# Patient Record
Sex: Male | Born: 1950 | Race: White | Hispanic: No | Marital: Single | Smoking: Former smoker
Health system: Southern US, Community
[De-identification: ages and names within clinical notes are randomized; demographics above are authoritative.]

## PROBLEM LIST (undated history)

## (undated) DIAGNOSIS — I10 Essential (primary) hypertension: Secondary | ICD-10-CM

## (undated) DIAGNOSIS — N400 Enlarged prostate without lower urinary tract symptoms: Secondary | ICD-10-CM

## (undated) DIAGNOSIS — K922 Gastrointestinal hemorrhage, unspecified: Secondary | ICD-10-CM

## (undated) DIAGNOSIS — M1990 Unspecified osteoarthritis, unspecified site: Secondary | ICD-10-CM

## (undated) DIAGNOSIS — E119 Type 2 diabetes mellitus without complications: Secondary | ICD-10-CM

## (undated) DIAGNOSIS — N189 Chronic kidney disease, unspecified: Secondary | ICD-10-CM

## (undated) DIAGNOSIS — IMO0001 Reserved for inherently not codable concepts without codable children: Secondary | ICD-10-CM

## (undated) DIAGNOSIS — E7800 Pure hypercholesterolemia, unspecified: Secondary | ICD-10-CM

## (undated) DIAGNOSIS — K769 Liver disease, unspecified: Secondary | ICD-10-CM

## (undated) DIAGNOSIS — I4891 Unspecified atrial fibrillation: Secondary | ICD-10-CM

## (undated) DIAGNOSIS — I209 Angina pectoris, unspecified: Secondary | ICD-10-CM

## (undated) HISTORY — PX: KNEE ARTHROCENTESIS: SUR44

## (undated) HISTORY — PX: OTHER SURGICAL HISTORY: SHX169

---

## 2009-01-15 DIAGNOSIS — I10 Essential (primary) hypertension: Secondary | ICD-10-CM | POA: Insufficient documentation

## 2015-07-30 ENCOUNTER — Observation Stay
Admission: AD | Admit: 2015-07-30 | Discharge: 2015-07-31 | Disposition: A | Discharge status: Home / Self Care | Payer: BLUE CROSS/BLUE SHIELD | Source: Ambulatory Visit | Attending: Cardiovascular Disease | Admitting: Cardiovascular Disease

## 2015-07-30 ENCOUNTER — Encounter: Payer: Self-pay | Admitting: *Deleted

## 2015-07-30 ENCOUNTER — Encounter
Admission: AD | Disposition: A | Discharge status: Home / Self Care | Payer: Self-pay | Source: Ambulatory Visit | Attending: Cardiovascular Disease

## 2015-07-30 DIAGNOSIS — F17210 Nicotine dependence, cigarettes, uncomplicated: Secondary | ICD-10-CM | POA: Diagnosis not present

## 2015-07-30 DIAGNOSIS — M109 Gout, unspecified: Secondary | ICD-10-CM | POA: Insufficient documentation

## 2015-07-30 DIAGNOSIS — I25119 Atherosclerotic heart disease of native coronary artery with unspecified angina pectoris: Principal | ICD-10-CM | POA: Insufficient documentation

## 2015-07-30 DIAGNOSIS — I10 Essential (primary) hypertension: Secondary | ICD-10-CM | POA: Diagnosis not present

## 2015-07-30 DIAGNOSIS — I2510 Atherosclerotic heart disease of native coronary artery without angina pectoris: Secondary | ICD-10-CM | POA: Diagnosis present

## 2015-07-30 DIAGNOSIS — Z79899 Other long term (current) drug therapy: Secondary | ICD-10-CM | POA: Diagnosis not present

## 2015-07-30 DIAGNOSIS — I249 Acute ischemic heart disease, unspecified: Secondary | ICD-10-CM

## 2015-07-30 DIAGNOSIS — Z7982 Long term (current) use of aspirin: Secondary | ICD-10-CM | POA: Diagnosis not present

## 2015-07-30 DIAGNOSIS — Z9861 Coronary angioplasty status: Secondary | ICD-10-CM

## 2015-07-30 DIAGNOSIS — I240 Acute coronary thrombosis not resulting in myocardial infarction: Secondary | ICD-10-CM | POA: Diagnosis present

## 2015-07-30 HISTORY — DX: Essential (primary) hypertension: I10

## 2015-07-30 HISTORY — PX: CARDIAC CATHETERIZATION: SHX172

## 2015-07-30 HISTORY — DX: Pure hypercholesterolemia, unspecified: E78.00

## 2015-07-30 HISTORY — DX: Reserved for inherently not codable concepts without codable children: IMO0001

## 2015-07-30 HISTORY — DX: Benign prostatic hyperplasia without lower urinary tract symptoms: N40.0

## 2015-07-30 HISTORY — DX: Type 2 diabetes mellitus without complications: E11.9

## 2015-07-30 HISTORY — DX: Angina pectoris, unspecified: I20.9

## 2015-07-30 LAB — BUN: BUN: 32 mg/dL — ABNORMAL HIGH (ref 6–20)

## 2015-07-30 LAB — CREATININE, SERUM
Creatinine, Ser: 1.77 mg/dL — ABNORMAL HIGH (ref 0.61–1.24)
GFR calc Af Amer: 46 mL/min — ABNORMAL LOW (ref >60)
GFR calc non Af Amer: 39 mL/min — ABNORMAL LOW (ref >60)

## 2015-07-30 LAB — POTASSIUM: Potassium: 4 mmol/L (ref 3.5–5.1)

## 2015-07-30 LAB — HEMATOCRIT: HEMATOCRIT: 38 % — AB (ref 40.0–52.0)

## 2015-07-30 LAB — PROTIME-INR
INR: 1.19
Prothrombin Time: 15.3 seconds — ABNORMAL HIGH (ref 11.4–15.0)

## 2015-07-30 LAB — HEPARIN LEVEL (UNFRACTIONATED): Heparin Unfractionated: <0.1 [IU]/mL — ABNORMAL LOW (ref 0.30–0.70)

## 2015-07-30 LAB — HEMOGLOBIN: HEMOGLOBIN: 13.4 g/dL (ref 13.0–18.0)

## 2015-07-30 LAB — APTT: aPTT: 46 seconds — ABNORMAL HIGH (ref 24–36)

## 2015-07-30 LAB — GLUCOSE, CAPILLARY: Glucose-Capillary: 217 mg/dL — ABNORMAL HIGH (ref 65–99)

## 2015-07-30 SURGERY — LEFT HEART CATH AND CORONARY ANGIOGRAPHY
Anesthesia: Moderate Sedation

## 2015-07-30 MED ORDER — SODIUM CHLORIDE 0.9% FLUSH: 3.0000 mL | INTRAVENOUS | Status: DC | PRN

## 2015-07-30 MED ORDER — SODIUM CHLORIDE 0.9% FLUSH: 3.0000 mL | Freq: Two times a day (BID) | INTRAVENOUS | Status: DC

## 2015-07-30 MED ORDER — SODIUM CHLORIDE 0.9 % IV SOLN: 250.0000 mL | INTRAVENOUS | Status: DC | PRN

## 2015-07-30 MED ORDER — BIVALIRUDIN BOLUS VIA INFUSION - CUPID
INTRAVENOUS | Status: DC | PRN
  Administered 2015-07-30: 97.95 mg via INTRAVENOUS

## 2015-07-30 MED ORDER — HEPARIN (PORCINE) IN NACL 100-0.45 UNIT/ML-% IJ SOLN
1100.0000 [IU]/h | INTRAMUSCULAR | Status: DC
  Administered 2015-07-30: 1100 [IU]/h via INTRAVENOUS
  Filled 2015-07-30 (×2): qty 250

## 2015-07-30 MED ORDER — ONDANSETRON HCL 4 MG/2ML IJ SOLN
4.0000 mg | Freq: Four times a day (QID) | INTRAMUSCULAR | Status: DC | PRN
  Administered 2015-07-30: 4 mg via INTRAVENOUS

## 2015-07-30 MED ORDER — MIDAZOLAM HCL 2 MG/2ML IJ SOLN
INTRAMUSCULAR | Status: AC
  Filled 2015-07-30: qty 2

## 2015-07-30 MED ORDER — SODIUM CHLORIDE 0.9 % WEIGHT BASED INFUSION: 1.0000 mL/kg/h | INTRAVENOUS | Status: AC

## 2015-07-30 MED ORDER — CLOPIDOGREL BISULFATE 75 MG PO TABS: 75.0000 mg | ORAL_TABLET | Freq: Every day | ORAL | Status: DC

## 2015-07-30 MED ORDER — MIDAZOLAM HCL 2 MG/2ML IJ SOLN
INTRAMUSCULAR | Status: DC | PRN
  Administered 2015-07-30: 0.5 mg via INTRAVENOUS
  Administered 2015-07-30: 1 mg via INTRAVENOUS
  Administered 2015-07-30: 0.5 mg via INTRAVENOUS

## 2015-07-30 MED ORDER — ASPIRIN EC 325 MG PO TBEC
325.0000 mg | DELAYED_RELEASE_TABLET | Freq: Every day | ORAL | Status: DC
  Administered 2015-07-30: 325 mg via ORAL
  Filled 2015-07-30: qty 1

## 2015-07-30 MED ORDER — NITROGLYCERIN 5 MG/ML IV SOLN
INTRAVENOUS | Status: AC
  Filled 2015-07-30: qty 10

## 2015-07-30 MED ORDER — SODIUM CHLORIDE 0.9% FLUSH
3.0000 mL | Freq: Two times a day (BID) | INTRAVENOUS | Status: DC
  Administered 2015-07-30: 3 mL via INTRAVENOUS

## 2015-07-30 MED ORDER — SODIUM CHLORIDE 0.9 % WEIGHT BASED INFUSION: 3.0000 mL/kg/h | INTRAVENOUS | Status: AC

## 2015-07-30 MED ORDER — SODIUM CHLORIDE 0.9 % IV SOLN
250.0000 mg | INTRAVENOUS | Status: DC | PRN
  Administered 2015-07-30: 1.75 mg/kg/h via INTRAVENOUS

## 2015-07-30 MED ORDER — SODIUM CHLORIDE 0.9 % IV SOLN
INTRAVENOUS | Status: DC
  Administered 2015-07-30: 08:00:00 via INTRAVENOUS

## 2015-07-30 MED ORDER — ONDANSETRON HCL 4 MG/2ML IJ SOLN
4.0000 mg | Freq: Four times a day (QID) | INTRAMUSCULAR | Status: DC | PRN
  Filled 2015-07-30: qty 2

## 2015-07-30 MED ORDER — ACETAMINOPHEN 325 MG PO TABS
650.0000 mg | ORAL_TABLET | ORAL | Status: DC | PRN
  Filled 2015-07-30: qty 2

## 2015-07-30 MED ORDER — CLOPIDOGREL BISULFATE 75 MG PO TABS
ORAL_TABLET | ORAL | Status: DC | PRN
  Administered 2015-07-30: 300 mg via ORAL

## 2015-07-30 MED ORDER — BIVALIRUDIN 250 MG IV SOLR
INTRAVENOUS | Status: AC
  Filled 2015-07-30: qty 250

## 2015-07-30 MED ORDER — NITROGLYCERIN 1 MG/10 ML FOR IR/CATH LAB
INTRA_ARTERIAL | Status: DC | PRN
  Administered 2015-07-30: 200 ug via INTRACORONARY

## 2015-07-30 MED ORDER — HEPARIN (PORCINE) IN NACL 2-0.9 UNIT/ML-% IJ SOLN
INTRAMUSCULAR | Status: AC
  Filled 2015-07-30: qty 1000

## 2015-07-30 MED ORDER — FENTANYL CITRATE (PF) 100 MCG/2ML IJ SOLN
INTRAMUSCULAR | Status: DC | PRN
  Administered 2015-07-30 (×3): 25 ug via INTRAVENOUS

## 2015-07-30 MED ORDER — FENTANYL CITRATE (PF) 100 MCG/2ML IJ SOLN
INTRAMUSCULAR | Status: AC
  Filled 2015-07-30: qty 2

## 2015-07-30 MED ORDER — CLOPIDOGREL BISULFATE 75 MG PO TABS
ORAL_TABLET | ORAL | Status: AC
  Filled 2015-07-30: qty 4

## 2015-07-30 MED ORDER — HEPARIN (PORCINE) IN NACL 100-0.45 UNIT/ML-% IJ SOLN
1100.0000 [IU]/h | INTRAMUSCULAR | Status: DC
  Filled 2015-07-30: qty 250

## 2015-07-30 MED ORDER — IOHEXOL 300 MG/ML  SOLN
INTRAMUSCULAR | Status: DC | PRN
  Administered 2015-07-30: 300 mL via INTRA_ARTERIAL

## 2015-07-30 MED ORDER — ACETAMINOPHEN 325 MG PO TABS
650.0000 mg | ORAL_TABLET | ORAL | Status: DC | PRN
  Administered 2015-07-31: 650 mg via ORAL

## 2015-07-30 SURGICAL SUPPLY — 15 items
CATH INFINITI 5FR ANG PIGTAIL (CATHETERS) ×4 IMPLANT
CATH INFINITI 5FR JL4 (CATHETERS) ×4 IMPLANT
CATH INFINITI 5FR JL5 (CATHETERS) ×4 IMPLANT
CATH INFINITI JR4 5F (CATHETERS) ×4 IMPLANT
CATH VISTA GUIDE 6FR XB4 (CATHETERS) ×4 IMPLANT
DEVICE CLOSURE MYNXGRIP 6/7F (Vascular Products) ×4 IMPLANT
DEVICE INFLAT 30 PLUS (MISCELLANEOUS) ×4 IMPLANT
KIT MANI 3VAL PERCEP (MISCELLANEOUS) ×4 IMPLANT
NEEDLE PERC 18GX7CM (NEEDLE) ×4 IMPLANT
PACK CARDIAC CATH (CUSTOM PROCEDURE TRAY) ×4 IMPLANT
SHEATH AVANTI 6FR X 11CM (SHEATH) ×4 IMPLANT
SHEATH PINNACLE 5F 10CM (SHEATH) ×4 IMPLANT
STENT XIENCE ALPINE RX 2.5X15 (Permanent Stent) ×4 IMPLANT
WIRE ASAHI PROWATER 180CM (WIRE) ×4 IMPLANT
WIRE EMERALD 3MM-J .035X150CM (WIRE) ×4 IMPLANT

## 2015-07-30 NOTE — Progress Notes (Signed)
Patient urine is tea colored and dark in color. Dr. Welton Flakes notified with a new order to D/C the Heparin drip. Dr. Welton Flakes told the RN to put a discharge order in for the patient in the morning and he stated the patient should continue his home medications including ASA and Plavix and will see the patient next week. The Khan's message has been conveyed to the patient. Will continue to monitor.

## 2015-07-30 NOTE — Progress Notes (Signed)
65 yo wm admitted from cath lab s/p lt heart cath with DES to mid LAD.  A&O x4.  No distress on ra.  Cardiac monitor placed on pt and verified with Candace, CNA. Pt denies chest pain at this time.  Lungs clear bil.  Abdomen benign.  Dressing to rt groin dry and intact, pulses equal bil.  No drainage or bleeding noted at this time.  Skin assessment checked with Leslie Dales, RN.  IVF infusing well rt hand, SL lt hand flushes well.  Oriented to room and surroundings. POC reviewed with pt and wife.  CB in reach, SR up x2.

## 2015-07-30 NOTE — Progress Notes (Signed)
Zofran 4 mg IV given for complaints of nausea, will monitor.

## 2015-07-30 NOTE — Progress Notes (Signed)
Report to floor nurse.  Stent card given to wife. Check right groin for bleeding or hematoma.  Patient will be on bedrest for 2 hours post sheath pull---out of bed at  11:45  Bilateral pulses are 3's DP's.Marland Kitchen

## 2015-07-30 NOTE — Progress Notes (Signed)
Pt states nausea better

## 2015-07-30 NOTE — Progress Notes (Signed)
Wyatt [name] is a 65 y.o. male  [id number]  Primary Cardiologist: Adrian Blackwater Reason for Consultation: chest pains and pci  HPI: 64yowm with repeated episodes of chest pains, ccta done in office showed high grade lesion in mid to distal LAD after equivacal stress test, and now came for cath.  Review of Systems: no PND/Orthopnea   Past Medical History  Diagnosis Date  . Hypertension   . Anginal pain (HCC)     Medications Prior to Admission  Medication Sig Dispense Refill  . allopurinol (ZYLOPRIM) 100 MG tablet Take 100 mg by mouth daily.    Marland Kitchen aspirin EC 81 MG tablet Take 81 mg by mouth daily.    . clopidogrel (PLAVIX) 75 MG tablet Take 75 mg by mouth daily.    . colchicine 0.6 MG tablet Take 0.6 mg by mouth daily.    Marland Kitchen glyBURIDE-metformin (GLUCOVANCE) 5-500 MG tablet Take 1 tablet by mouth 2 (two) times daily with a meal.    . isosorbide dinitrate (ISORDIL) 30 MG tablet Take 30 mg by mouth 4 (four) times daily.    . metoprolol succinate (TOPROL-XL) 100 MG 24 hr tablet Take 100 mg by mouth daily. Take with or immediately following a meal.    . niacin (NIASPAN) 1000 MG CR tablet Take 1,000 mg by mouth at bedtime.    . nitroGLYCERIN (NITROSTAT) 0.4 MG SL tablet Place 0.4 mg under the tongue every 5 (five) minutes as needed for chest pain.    . valsartan-hydrochlorothiazide (DIOVAN-HCT) 320-25 MG tablet Take 1 tablet by mouth daily.       Marland Kitchen aspirin EC  325 mg Oral Daily  . [START ON 07/31/2015] clopidogrel  75 mg Oral Q breakfast  . sodium chloride flush  3 mL Intravenous Q12H  . sodium chloride flush  3 mL Intravenous Q12H  . sodium chloride flush  3 mL Intravenous Q12H    Infusions: . sodium chloride 75 mL/hr at 07/30/15 0825  . sodium chloride    . sodium chloride      No Known Allergies  Social History   Social History  . Marital Status: Single    Spouse Name: N/A  . Number of Children: N/A  . Years of Education: N/A   Occupational History  . Not on file.    Social History Main Topics  . Smoking status: Current Every Day Smoker -- 1.00 packs/day for 2 years    Types: Cigarettes  . Smokeless tobacco: Former Neurosurgeon  . Alcohol Use: 1.2 oz/week    2 Cans of beer per week  . Drug Use: No  . Sexual Activity: Not on file   Other Topics Concern  . Not on file   Social History Narrative  . No narrative on file    No family history on file.  PHYSICAL EXAM: Filed Vitals:   07/30/15 1040 07/30/15 1050  BP: 114/70 111/59  Pulse: 69 70  Resp: 21 16    No intake or output data in the 24 hours ending 07/30/15 1115  General:  Well appearing. No respiratory difficulty HEENT: normal Neck: supple. no JVD. Carotids 2+ bilat; no bruits. No lymphadenopathy or thryomegaly appreciated. Cor: PMI nondisplaced. Regular rate & rhythm. No rubs, gallops or murmurs. Lungs: clear Abdomen: soft, nontender, nondistended. No hepatosplenomegaly. No bruits or masses. Good bowel sounds. Extremities: no cyanosis, clubbing, rash, edema Neuro: alert & oriented x 3, cranial nerves grossly intact. moves all 4 extremities w/o difficulty. Affect pleasant.  ECG: NSR no  acute changes on ekg done in office yesterday  Results for orders placed or performed during the hospital encounter of 07/30/15 (from the past 24 hour(s))  Hemoglobin     Status: None   Collection Time: 07/30/15  7:33 AM  Result Value Ref Range   Hemoglobin 13.4 13.0 - 18.0 g/dL  Hematocrit     Status: Abnormal   Collection Time: 07/30/15  7:33 AM  Result Value Ref Range   HCT 38.0 (L) 40.0 - 52.0 %  BUN     Status: Abnormal   Collection Time: 07/30/15  7:33 AM  Result Value Ref Range   BUN 32 (H) 6 - 20 mg/dL  Creatinine, serum     Status: Abnormal   Collection Time: 07/30/15  7:33 AM  Result Value Ref Range   Creatinine, Ser 1.77 (H) 0.61 - 1.24 mg/dL   GFR calc non Af Amer 39 (L) >60 mL/min   GFR calc Af Amer 46 (L) >60 mL/min  Potassium     Status: None   Collection Time: 07/30/15  7:33  AM  Result Value Ref Range   Potassium 4.0 3.5 - 5.1 mmol/L   No results found.   ASSESSMENT AND PLAN: A cute coronary syndrome with cardiac cath showing mid LAD 80%,whic had PCI/DES and admitted for observation and will be discharged in am.  [name] A

## 2015-07-30 NOTE — Plan of Care (Signed)
Problem: Pain Managment: Goal: General experience of comfort will improve Outcome: Progressing Prn medications  Problem: Tissue Perfusion: Goal: Risk factors for ineffective tissue perfusion will decrease Outcome: Progressing Plavix/ASA  Problem: Phase II Progression Outcomes Goal: Cath/PCI Day Path if indicated Outcome: Progressing Stent to Mid LAD

## 2015-07-30 NOTE — Progress Notes (Signed)
ANTICOAGULATION CONSULT NOTE - Initial Consult  Pharmacy Consult for Heparin Drip Indication: chest pain/ACS and ACS/STEMI  No Known Allergies  Patient Measurements: Height:  (185.4 cm) Weight: 282 lb 6.4 oz (128.096 kg) IBW/kg (Calculated) : 79.9 Heparin Dosing Weight: 108 kg  Vital Signs: Temp: 98.1 F (36.7 C) (02/24 1156) Temp Source: Oral (02/24 1156) BP: 108/70 mmHg (02/24 1156) Pulse Rate: 61 (02/24 1156)  Labs:  Recent Labs  07/30/15 0733  HGB 13.4  HCT 38.0*  CREATININE 1.77*    Estimated Creatinine Clearance: 60.7 mL/min (by C-G formula based on Cr of 1.77).   Medical History: Past Medical History  Diagnosis Date  . Hypertension   . Anginal pain (HCC)   . Diabetes mellitus without complication (HCC)   . High cholesterol   . Shortness of breath dyspnea   . Enlarged prostate     Medications:  Scheduled:  . aspirin EC  325 mg Oral Daily  . [START ON 07/31/2015] clopidogrel  75 mg Oral Q breakfast  . sodium chloride flush  3 mL Intravenous Q12H  . sodium chloride flush  3 mL Intravenous Q12H   Infusions:  . heparin      Assessment: Pharmacy consulted to initiate heparin drip in a 66 yo male status post PCI with DES.  Spoke with MD Welton Flakes regarding heparin drip consult and MD wishes to proceed with heparin drip and start infusion now.    APTT pending (except high value due to use of bivalirudin in cath), INR: pending  Goal of Therapy:  Heparin level 0.2-0.5 units/ml Monitor platelets by anticoagulation protocol: Yes   Plan:  No bolus as patient has received bivalirudin during procedure. Will initiate with 10-12 units/kg/hr as patient just received cath. Rate to be started at 1100 units/hr.  Please target lower HL value due to the above reasons.  HL ordered for 2100 and CBC in AM.   Yasenia Reedy G 07/30/2015,2:37 PM

## 2015-07-31 DIAGNOSIS — I25119 Atherosclerotic heart disease of native coronary artery with unspecified angina pectoris: Secondary | ICD-10-CM | POA: Diagnosis not present

## 2015-07-31 LAB — CBC
HEMATOCRIT: 37.5 % — AB (ref 40.0–52.0)
Hemoglobin: 13.1 g/dL (ref 13.0–18.0)
MCH: 30.1 pg (ref 26.0–34.0)
MCHC: 35 g/dL (ref 32.0–36.0)
MCV: 86.2 fL (ref 80.0–100.0)
Platelets: 146 10*3/uL — ABNORMAL LOW (ref 150–440)
RBC: 4.35 MIL/uL — AB (ref 4.40–5.90)
RDW: 13 % (ref 11.5–14.5)
WBC: 9.1 10*3/uL (ref 3.8–10.6)

## 2015-07-31 LAB — BASIC METABOLIC PANEL
Anion gap: 6 (ref 5–15)
BUN: 27 mg/dL — AB (ref 6–20)
CHLORIDE: 105 mmol/L (ref 101–111)
CO2: 24 mmol/L (ref 22–32)
Calcium: 8.5 mg/dL — ABNORMAL LOW (ref 8.9–10.3)
Creatinine, Ser: 1.6 mg/dL — ABNORMAL HIGH (ref 0.61–1.24)
GFR calc non Af Amer: 45 mL/min — ABNORMAL LOW (ref >60)
GFR, EST AFRICAN AMERICAN: 52 mL/min — AB (ref >60)
Glucose, Bld: 204 mg/dL — ABNORMAL HIGH (ref 65–99)
POTASSIUM: 3.9 mmol/L (ref 3.5–5.1)
Sodium: 135 mmol/L (ref 135–145)

## 2015-07-31 NOTE — Progress Notes (Signed)
Patient d/c'd home. Education provided, no questions at this time. Patient picked up by family. Telemetry removed. Patient refused to take any medication this am, stated that he would take his medication once he was at home.  Trudee Kuster

## 2015-08-03 ENCOUNTER — Encounter: Payer: Self-pay | Admitting: Cardiovascular Disease

## 2015-11-29 ENCOUNTER — Observation Stay
Admission: AD | Admit: 2015-11-29 | Discharge: 2015-12-01 | Disposition: A | Discharge status: Home / Self Care | Payer: BLUE CROSS/BLUE SHIELD | Source: Ambulatory Visit | Attending: Internal Medicine | Admitting: Internal Medicine

## 2015-11-29 ENCOUNTER — Other Ambulatory Visit: Payer: Self-pay | Admitting: Cardiovascular Disease

## 2015-11-29 ENCOUNTER — Encounter: Payer: Self-pay | Admitting: *Deleted

## 2015-11-29 ENCOUNTER — Observation Stay: Payer: BLUE CROSS/BLUE SHIELD

## 2015-11-29 DIAGNOSIS — Z7982 Long term (current) use of aspirin: Secondary | ICD-10-CM | POA: Insufficient documentation

## 2015-11-29 DIAGNOSIS — N183 Chronic kidney disease, stage 3 (moderate): Secondary | ICD-10-CM | POA: Diagnosis not present

## 2015-11-29 DIAGNOSIS — Z87891 Personal history of nicotine dependence: Secondary | ICD-10-CM | POA: Insufficient documentation

## 2015-11-29 DIAGNOSIS — I25110 Atherosclerotic heart disease of native coronary artery with unstable angina pectoris: Principal | ICD-10-CM | POA: Insufficient documentation

## 2015-11-29 DIAGNOSIS — N400 Enlarged prostate without lower urinary tract symptoms: Secondary | ICD-10-CM | POA: Insufficient documentation

## 2015-11-29 DIAGNOSIS — Z820 Family history of epilepsy and other diseases of the nervous system: Secondary | ICD-10-CM | POA: Insufficient documentation

## 2015-11-29 DIAGNOSIS — M549 Dorsalgia, unspecified: Secondary | ICD-10-CM | POA: Diagnosis not present

## 2015-11-29 DIAGNOSIS — I129 Hypertensive chronic kidney disease with stage 1 through stage 4 chronic kidney disease, or unspecified chronic kidney disease: Secondary | ICD-10-CM | POA: Insufficient documentation

## 2015-11-29 DIAGNOSIS — I517 Cardiomegaly: Secondary | ICD-10-CM | POA: Insufficient documentation

## 2015-11-29 DIAGNOSIS — E1122 Type 2 diabetes mellitus with diabetic chronic kidney disease: Secondary | ICD-10-CM | POA: Insufficient documentation

## 2015-11-29 DIAGNOSIS — K219 Gastro-esophageal reflux disease without esophagitis: Secondary | ICD-10-CM | POA: Insufficient documentation

## 2015-11-29 DIAGNOSIS — Z8249 Family history of ischemic heart disease and other diseases of the circulatory system: Secondary | ICD-10-CM | POA: Insufficient documentation

## 2015-11-29 DIAGNOSIS — Z7984 Long term (current) use of oral hypoglycemic drugs: Secondary | ICD-10-CM | POA: Insufficient documentation

## 2015-11-29 DIAGNOSIS — E7800 Pure hypercholesterolemia, unspecified: Secondary | ICD-10-CM | POA: Diagnosis not present

## 2015-11-29 DIAGNOSIS — Z955 Presence of coronary angioplasty implant and graft: Secondary | ICD-10-CM | POA: Diagnosis not present

## 2015-11-29 DIAGNOSIS — Z7902 Long term (current) use of antithrombotics/antiplatelets: Secondary | ICD-10-CM | POA: Insufficient documentation

## 2015-11-29 DIAGNOSIS — M109 Gout, unspecified: Secondary | ICD-10-CM | POA: Diagnosis not present

## 2015-11-29 DIAGNOSIS — E785 Hyperlipidemia, unspecified: Secondary | ICD-10-CM | POA: Insufficient documentation

## 2015-11-29 DIAGNOSIS — I200 Unstable angina: Secondary | ICD-10-CM | POA: Diagnosis present

## 2015-11-29 HISTORY — DX: Chronic kidney disease, unspecified: N18.9

## 2015-11-29 LAB — COMPREHENSIVE METABOLIC PANEL
ALBUMIN: 4.3 g/dL (ref 3.5–5.0)
ALT: 24 U/L (ref 17–63)
ANION GAP: 8 (ref 5–15)
AST: 22 U/L (ref 15–41)
Alkaline Phosphatase: 49 U/L (ref 38–126)
BILIRUBIN TOTAL: 0.3 mg/dL (ref 0.3–1.2)
BUN: 43 mg/dL — ABNORMAL HIGH (ref 6–20)
CHLORIDE: 107 mmol/L (ref 101–111)
CO2: 23 mmol/L (ref 22–32)
Calcium: 9.5 mg/dL (ref 8.9–10.3)
Creatinine, Ser: 2.07 mg/dL — ABNORMAL HIGH (ref 0.61–1.24)
GFR calc Af Amer: 38 mL/min — ABNORMAL LOW (ref >60)
GFR calc non Af Amer: 33 mL/min — ABNORMAL LOW (ref >60)
GLUCOSE: 207 mg/dL — AB (ref 65–99)
POTASSIUM: 4.3 mmol/L (ref 3.5–5.1)
SODIUM: 138 mmol/L (ref 135–145)
TOTAL PROTEIN: 7.3 g/dL (ref 6.5–8.1)

## 2015-11-29 LAB — CBC
HEMATOCRIT: 39.2 % — AB (ref 40.0–52.0)
Hemoglobin: 14.1 g/dL (ref 13.0–18.0)
MCH: 30.1 pg (ref 26.0–34.0)
MCHC: 35.9 g/dL (ref 32.0–36.0)
MCV: 83.9 fL (ref 80.0–100.0)
PLATELETS: 147 10*3/uL — AB (ref 150–440)
RBC: 4.67 MIL/uL (ref 4.40–5.90)
RDW: 12.7 % (ref 11.5–14.5)
WBC: 8.3 10*3/uL (ref 3.8–10.6)

## 2015-11-29 LAB — TROPONIN I
Troponin I: <0.03 ng/mL (ref <0.031)
Troponin I: <0.03 ng/mL (ref <0.031)

## 2015-11-29 LAB — GLUCOSE, CAPILLARY: Glucose-Capillary: 161 mg/dL — ABNORMAL HIGH (ref 65–99)

## 2015-11-29 MED ORDER — ASPIRIN 81 MG PO CHEW
81.0000 mg | CHEWABLE_TABLET | ORAL | Status: AC
  Administered 2015-11-30: 81 mg via ORAL
  Filled 2015-11-29: qty 1

## 2015-11-29 MED ORDER — ASPIRIN 81 MG PO CHEW: 81.0000 mg | CHEWABLE_TABLET | ORAL | Status: DC

## 2015-11-29 MED ORDER — SODIUM CHLORIDE 0.9% FLUSH: 3.0000 mL | INTRAVENOUS | Status: DC | PRN

## 2015-11-29 MED ORDER — ADULT MULTIVITAMIN W/MINERALS CH
1.0000 | ORAL_TABLET | Freq: Every day | ORAL | Status: DC
  Administered 2015-12-01: 1 via ORAL
  Filled 2015-11-29: qty 1

## 2015-11-29 MED ORDER — INSULIN ASPART 100 UNIT/ML ~~LOC~~ SOLN
0.0000 [IU] | Freq: Three times a day (TID) | SUBCUTANEOUS | Status: DC
  Administered 2015-11-30 – 2015-12-01 (×2): 2 [IU] via SUBCUTANEOUS
  Administered 2015-12-01: 3 [IU] via SUBCUTANEOUS
  Filled 2015-11-29: qty 2
  Filled 2015-11-29: qty 3
  Filled 2015-11-29: qty 2

## 2015-11-29 MED ORDER — OMEGA-3-ACID ETHYL ESTERS 1 G PO CAPS
1.0000 g | ORAL_CAPSULE | Freq: Every day | ORAL | Status: DC
  Administered 2015-12-01: 1 g via ORAL
  Filled 2015-11-29: qty 1

## 2015-11-29 MED ORDER — SODIUM CHLORIDE 0.9% FLUSH
3.0000 mL | Freq: Two times a day (BID) | INTRAVENOUS | Status: DC
  Administered 2015-11-30 – 2015-12-01 (×2): 3 mL via INTRAVENOUS

## 2015-11-29 MED ORDER — SODIUM CHLORIDE 0.9% FLUSH: 3.0000 mL | Freq: Two times a day (BID) | INTRAVENOUS | Status: DC

## 2015-11-29 MED ORDER — ASPIRIN EC 81 MG PO TBEC: 81.0000 mg | DELAYED_RELEASE_TABLET | Freq: Every day | ORAL | Status: DC

## 2015-11-29 MED ORDER — SODIUM CHLORIDE 0.9 % IV SOLN
INTRAVENOUS | Status: DC
  Administered 2015-11-29 – 2015-11-30 (×2): via INTRAVENOUS

## 2015-11-29 MED ORDER — ACETAMINOPHEN 650 MG RE SUPP: 650.0000 mg | Freq: Four times a day (QID) | RECTAL | Status: DC | PRN

## 2015-11-29 MED ORDER — SODIUM CHLORIDE 0.9 % WEIGHT BASED INFUSION: 1.0000 mL/kg/h | INTRAVENOUS | Status: DC

## 2015-11-29 MED ORDER — INSULIN ASPART 100 UNIT/ML ~~LOC~~ SOLN: 0.0000 [IU] | Freq: Every day | SUBCUTANEOUS | Status: DC

## 2015-11-29 MED ORDER — HEPARIN (PORCINE) IN NACL 100-0.45 UNIT/ML-% IJ SOLN
1500.0000 [IU]/h | INTRAMUSCULAR | Status: DC
  Administered 2015-11-29 – 2015-11-30 (×2): 1500 [IU]/h via INTRAVENOUS
  Filled 2015-11-29 (×5): qty 250

## 2015-11-29 MED ORDER — METOPROLOL SUCCINATE ER 25 MG PO TB24
25.0000 mg | ORAL_TABLET | Freq: Every day | ORAL | Status: DC
  Administered 2015-12-01: 25 mg via ORAL
  Filled 2015-11-29: qty 1

## 2015-11-29 MED ORDER — NITROGLYCERIN 0.4 MG SL SUBL: 0.4000 mg | SUBLINGUAL_TABLET | SUBLINGUAL | Status: DC | PRN

## 2015-11-29 MED ORDER — SODIUM CHLORIDE 0.9 % WEIGHT BASED INFUSION: 3.0000 mL/kg/h | INTRAVENOUS | Status: DC

## 2015-11-29 MED ORDER — NIACIN 500 MG PO TABS
1000.0000 mg | ORAL_TABLET | Freq: Every day | ORAL | Status: DC
  Administered 2015-11-29 – 2015-11-30 (×2): 1000 mg via ORAL
  Filled 2015-11-29: qty 20
  Filled 2015-11-29 (×2): qty 2

## 2015-11-29 MED ORDER — COLCHICINE 0.6 MG PO TABS
0.6000 mg | ORAL_TABLET | Freq: Every day | ORAL | Status: DC
  Administered 2015-12-01: 0.6 mg via ORAL
  Filled 2015-11-29: qty 1

## 2015-11-29 MED ORDER — CLOPIDOGREL BISULFATE 75 MG PO TABS
75.0000 mg | ORAL_TABLET | Freq: Every day | ORAL | Status: DC
  Administered 2015-11-30: 75 mg via ORAL
  Filled 2015-11-29: qty 1

## 2015-11-29 MED ORDER — VALSARTAN-HYDROCHLOROTHIAZIDE 320-12.5 MG PO TABS: 1.0000 | ORAL_TABLET | Freq: Every day | ORAL | Status: DC

## 2015-11-29 MED ORDER — HEPARIN BOLUS VIA INFUSION
4000.0000 [IU] | Freq: Once | INTRAVENOUS | Status: AC
  Administered 2015-11-29: 4000 [IU] via INTRAVENOUS
  Filled 2015-11-29: qty 4000

## 2015-11-29 MED ORDER — SODIUM CHLORIDE 0.9 % WEIGHT BASED INFUSION: 3.0000 mL/kg/h | INTRAVENOUS | Status: AC

## 2015-11-29 MED ORDER — TRAMADOL HCL 50 MG PO TABS
50.0000 mg | ORAL_TABLET | Freq: Four times a day (QID) | ORAL | Status: DC | PRN
  Administered 2015-11-29: 50 mg via ORAL
  Filled 2015-11-29: qty 1

## 2015-11-29 MED ORDER — ALLOPURINOL 100 MG PO TABS
100.0000 mg | ORAL_TABLET | Freq: Every day | ORAL | Status: DC
  Administered 2015-12-01: 100 mg via ORAL
  Filled 2015-11-29: qty 1

## 2015-11-29 MED ORDER — DOXAZOSIN MESYLATE 4 MG PO TABS
4.0000 mg | ORAL_TABLET | Freq: Every day | ORAL | Status: DC
  Administered 2015-11-29 – 2015-11-30 (×2): 4 mg via ORAL
  Filled 2015-11-29 (×2): qty 1

## 2015-11-29 MED ORDER — SODIUM CHLORIDE 0.9 % IV SOLN: 250.0000 mL | INTRAVENOUS | Status: DC | PRN

## 2015-11-29 MED ORDER — ATORVASTATIN CALCIUM 10 MG PO TABS
10.0000 mg | ORAL_TABLET | Freq: Every day | ORAL | Status: DC
  Administered 2015-11-29: 10 mg via ORAL
  Filled 2015-11-29: qty 1

## 2015-11-29 MED ORDER — ACETAMINOPHEN 325 MG PO TABS: 650.0000 mg | ORAL_TABLET | Freq: Four times a day (QID) | ORAL | Status: DC | PRN

## 2015-11-29 MED ORDER — PANTOPRAZOLE SODIUM 40 MG PO TBEC
40.0000 mg | DELAYED_RELEASE_TABLET | Freq: Two times a day (BID) | ORAL | Status: DC
  Administered 2015-11-29 – 2015-12-01 (×3): 40 mg via ORAL
  Filled 2015-11-29 (×3): qty 1

## 2015-11-29 NOTE — H&P (Signed)
[facility] [place] [facility]   PATIENT NAME: Wyatt [name]    MR#:  [id number]  DATE OF BIRTH:  [birth year]  DATE OF ADMISSION:  11/29/2015  PRIMARY CARE PHYSICIAN: Bennie Pierini in [facility]  REQUESTING/REFERRING PHYSICIAN: Dr. Cain Saupe  CHIEF COMPLAINT:  Chest pain  HISTORY OF PRESENT ILLNESS:  Wyatt [name]  is a 65 y.o. male with a known history of CAD, diabetes, hypertension, hyperlipidemia. On Friday he was started on metoprolol because of rapid heart rate. He hasn't felt well since. On Saturday he had a couple episodes of chest pressure when he was walking up a hill. He had a rest for 30 minutes. The pressure was in the center of his chest felt like it was caving in. Associated with some sweating and nausea And shortness of breath. Today he saw Dr. Welton Flakes in the office and he was on the treadmill and got chest pain. They gave him 2 nitroglycerin sprays which helped. The stress test was read as abnormal and he was sent into the hospital for direct admission and the plan is for cardiac catheterization in the morning.  PAST MEDICAL HISTORY:   Past Medical History  Diagnosis Date  . Hypertension   . Anginal pain (HCC)   . Diabetes mellitus without complication (HCC)   . High cholesterol   . Shortness of breath dyspnea   . Enlarged prostate   . Chronic kidney disease     PAST SURGICAL HISTORY:   Past Surgical History  Procedure Laterality Date  . Cardiac catheterization Left 07/30/2015    Procedure: Left Heart Cath and Coronary Angiography;  Surgeon: Laurier Nancy, MD;  Location: [facility] INVASIVE CV LAB;  Service: Cardiovascular;  Laterality: Left;  . Cardiac catheterization N/A 07/30/2015    Procedure: Coronary Stent Intervention;  Surgeon: Marcina Millard, MD;  Location: [facility] INVASIVE CV LAB;  Service: Cardiovascular;  Laterality: N/A;  . Knee arthrocentesis Right   . Kidney stones      SOCIAL HISTORY:   Social History   Substance Use Topics  . Smoking status: Former Smoker -- 0.00 packs/day for 0 years    Quit date: 07/29/1969  . Smokeless tobacco: Former Neurosurgeon  . Alcohol Use: 1.2 oz/week    2 Cans of beer per week    FAMILY HISTORY:   Family History  Problem Relation Age of Onset  . CAD Mother   . Dementia Mother   . Parkinson's disease Mother   . CAD Father     DRUG ALLERGIES:  No Known Allergies  REVIEW OF SYSTEMS:  CONSTITUTIONAL: No fever. Positive for chills and sweats and fatigue. EYES: No blurred or double vision.  EARS, NOSE, AND THROAT: No tinnitus or ear pain. No sore throat RESPIRATORY: No cough, shortness of breath, wheezing or hemoptysis.  CARDIOVASCULAR: Positive for chest pain, and edema.  GASTROINTESTINAL: Some nausea. No vomiting or abdominal pain. No blood in bowel movements. Occasional diarrhea GENITOURINARY: No dysuria, hematuria.  ENDOCRINE: No polyuria, nocturia,  HEMATOLOGY: No anemia, easy bruising or bleeding SKIN: No rash or lesion. MUSCULOSKELETAL: No joint pain or arthritis.   NEUROLOGIC: No tingling, numbness, weakness.  PSYCHIATRY: No anxiety or depression.   MEDICATIONS AT HOME:   Prior to Admission medications   Medication Sig Start Date End Date Taking? Authorizing Provider  allopurinol (ZYLOPRIM) 100 MG tablet Take 100 mg by mouth daily.   Yes Historical Provider, MD  aspirin EC 81 MG tablet Take 81 mg by mouth daily.  Yes Historical Provider, MD  atorvastatin (LIPITOR) 10 MG tablet Take 10 mg by mouth at bedtime.   Yes Historical Provider, MD  celecoxib (CELEBREX) 200 MG capsule Take 200 mg by mouth at bedtime.   Yes Historical Provider, MD  Cinnamon 500 MG TABS Take 500 mg by mouth daily.   Yes Historical Provider, MD  clopidogrel (PLAVIX) 75 MG tablet Take 75 mg by mouth daily.   Yes Historical Provider, MD  colchicine 0.6 MG tablet Take 0.6 mg by mouth daily.   Yes Historical Provider, MD  doxazosin (CARDURA) 4 MG tablet Take 4 mg by mouth at  bedtime.   Yes Historical Provider, MD  glyBURIDE-metformin (GLUCOVANCE) 5-500 MG tablet Take 1 tablet by mouth 2 (two) times daily with a meal.   Yes Historical Provider, MD  metoprolol succinate (TOPROL-XL) 100 MG 24 hr tablet Take 100 mg by mouth daily.    Yes Historical Provider, MD  Multiple Vitamin (MULTIVITAMIN WITH MINERALS) TABS tablet Take 1 tablet by mouth daily.   Yes Historical Provider, MD  niacin (NIASPAN) 1000 MG CR tablet Take 1,000 mg by mouth at bedtime.   Yes Historical Provider, MD  nitroGLYCERIN (NITROSTAT) 0.4 MG SL tablet Place 0.4 mg under the tongue every 5 (five) minutes as needed for chest pain.   Yes Historical Provider, MD  omega-3 acid ethyl esters (LOVAZA) 1 g capsule Take 1 g by mouth daily.   Yes Historical Provider, MD  pantoprazole (PROTONIX) 40 MG tablet Take 40 mg by mouth 2 (two) times daily.   Yes Historical Provider, MD  traMADol (ULTRAM) 50 MG tablet Take 50 mg by mouth every 6 (six) hours as needed for moderate pain.   Yes Historical Provider, MD  valsartan-hydrochlorothiazide (DIOVAN-HCT) 320-12.5 MG tablet Take 1 tablet by mouth daily.   Yes Historical Provider, MD      VITAL SIGNS:  Blood pressure 115/62, pulse 70, temperature 98.3 F (36.8 C), temperature source Oral, resp. rate 18, height 6\' 2"  (1.88 m), weight 128.141 kg (282 lb 8 oz), SpO2 98 %.  PHYSICAL EXAMINATION:  GENERAL:  65 y.o.-year-old patient lying in the bed with no acute distress.  EYES: Pupils equal, round, reactive to light and accommodation. No scleral icterus. Extraocular muscles intact.  HEENT: Head atraumatic, normocephalic. Oropharynx and nasopharynx clear.  NECK:  Supple, no jugular venous distention. No thyroid enlargement, no tenderness.  LUNGS: Normal breath sounds bilaterally, no wheezing, rales,rhonchi or crepitation. No use of accessory muscles of respiration.  CARDIOVASCULAR: S1, S2 normal. No murmurs, rubs, or gallops.  ABDOMEN: Soft, nontender, nondistended. Bowel  sounds present. No organomegaly or mass.  EXTREMITIES: 2+ pedal edema. No cyanosis, or clubbing.  NEUROLOGIC: Cranial nerves II through XII are intact. Muscle strength 5/5 in all extremities. Sensation intact. Gait not checked.  PSYCHIATRIC: The patient is alert and oriented x 3.  SKIN: No rash, lesion, or ulcer.   LABORATORY PANEL:   Laboratory data pending  RADIOLOGY:   Chest x-ray ordered  IMPRESSION AND PLAN:   1. Unstable angina. Patient will be nothing by mouth after midnight for cardiac catheterization in the a.m. Start IV heparin. Continue aspirin and Plavix. Lower the dose of the beta blocker since that was just started on Friday. 2. Essential hypertension. Decrease the dose of Toprol and hold Diovan HCT 3. Type 2 diabetes mellitus hold diabetic medications and put on sliding scale. 4. Hyperlipidemia unspecified on Lipitor. Check a lipid profile in the a.m. 5. Chronic kidney disease stage III. Give IV  fluid hydration prior to cardiac catheter 6. Gout. Continue renally dosed medication 7. Back pain when he lies down. Patient already on medication for gastroesophageal reflux disease. This does not seem to be an aortic dissection because he is not describing the pain in this way. Continue to monitor closely.  All the records are reviewed and case discussed with ED provider. Management plans discussed with the patient, family and they are in agreement.  CODE STATUS: Full code  TOTAL TIME TAKING CARE OF THIS PATIENT: 55 minutes.    Alford HighlandWIETING, Deantre Bourdon M.D on 11/29/2015 at 5:50 PM  Between 7am to 6pm - Pager - [phone]  After 6pm call admission pager [phone]  [facility] Office  [phone]  CC: Primary care physician; Benn[facility] PieriniHeather [facility]

## 2015-11-29 NOTE — Progress Notes (Signed)
ANTICOAGULATION CONSULT NOTE - Initial Consult  Pharmacy Consult for Heparin  Indication: chest pain/ACS  No Known Allergies  Patient Measurements: Height: 6\' 2"  (188 cm) (stated) Weight: 282 lb 8 oz (128.141 kg) (admission) IBW/kg (Calculated) : 82.2 Heparin Dosing Weight: 110.4 kg   Vital Signs: Temp: 98.3 F (36.8 C) (06/26 1601) Temp Source: Oral (06/26 1601) BP: 115/62 mmHg (06/26 1601) Pulse Rate: 70 (06/26 1601)  Labs: No results for input(s): HGB, HCT, PLT, APTT, LABPROT, INR, HEPARINUNFRC, HEPRLOWMOCWT, CREATININE, CKTOTAL, CKMB, TROPONINI in the last 72 hours.  CrCl cannot be calculated (Patient has no serum creatinine result on file.).   Medical History: Past Medical History  Diagnosis Date  . Hypertension   . Anginal pain (HCC)   . Diabetes mellitus without complication (HCC)   . High cholesterol   . Shortness of breath dyspnea   . Enlarged prostate     Medications:  Prescriptions prior to admission  Medication Sig Dispense Refill Last Dose  . allopurinol (ZYLOPRIM) 100 MG tablet Take 100 mg by mouth daily.   11/29/2015 at Unknown time  . aspirin EC 81 MG tablet Take 81 mg by mouth daily.   11/29/2015 at Unknown time  . clopidogrel (PLAVIX) 75 MG tablet Take 75 mg by mouth daily.   11/29/2015 at Unknown time  . colchicine 0.6 MG tablet Take 0.6 mg by mouth daily.   11/29/2015 at Unknown time  . glyBURIDE-metformin (GLUCOVANCE) 5-500 MG tablet Take 1 tablet by mouth 2 (two) times daily with a meal.   11/29/2015 at Unknown time  . niacin (NIASPAN) 1000 MG CR tablet Take 1,000 mg by mouth at bedtime.   11/29/2015 at Unknown time  . nitroGLYCERIN (NITROSTAT) 0.4 MG SL tablet Place 0.4 mg under the tongue every 5 (five) minutes as needed for chest pain.   11/29/2015 at Unknown time  . valsartan-hydrochlorothiazide (DIOVAN-HCT) 320-25 MG tablet Take 1 tablet by mouth daily.   11/29/2015 at Unknown time  . isosorbide dinitrate (ISORDIL) 30 MG tablet Take 30 mg by mouth 4  (four) times daily. Reported on 11/29/2015   Not Taking at Unknown time  . metoprolol succinate (TOPROL-XL) 100 MG 24 hr tablet Take 100 mg by mouth daily. Reported on 11/29/2015   Not Taking at Unknown time    Assessment: CrCl =  Pharmacy consulted to dose heparin in this 65 year old male admitted with ACS.   Goal of Therapy:  Heparin level 0.3-0.7 units/ml Monitor platelets by anticoagulation protocol: Yes   Plan:  Give 4000 units bolus x 1 Start heparin infusion at 1500 units/hr Check anti-Xa level in 6 hours and daily while on heparin Continue to monitor H&H and platelets  Seymone Forlenza D 11/29/2015,4:23 PM

## 2015-11-30 ENCOUNTER — Encounter
Admission: AD | Disposition: A | Discharge status: Home / Self Care | Payer: Self-pay | Source: Ambulatory Visit | Attending: Internal Medicine

## 2015-11-30 ENCOUNTER — Ambulatory Visit
Admission: RE | Admit: 2015-11-30 | Payer: BLUE CROSS/BLUE SHIELD | Source: Ambulatory Visit | Admitting: Cardiovascular Disease

## 2015-11-30 ENCOUNTER — Ambulatory Visit: Admit: 2015-11-30 | Payer: Self-pay | Admitting: Internal Medicine

## 2015-11-30 ENCOUNTER — Encounter: Payer: Self-pay | Admitting: *Deleted

## 2015-11-30 ENCOUNTER — Other Ambulatory Visit: Payer: Self-pay

## 2015-11-30 DIAGNOSIS — I25110 Atherosclerotic heart disease of native coronary artery with unstable angina pectoris: Secondary | ICD-10-CM | POA: Diagnosis not present

## 2015-11-30 HISTORY — PX: CARDIAC CATHETERIZATION: SHX172

## 2015-11-30 LAB — LIPID PANEL
CHOL/HDL RATIO: 3.9 ratio
Cholesterol: 106 mg/dL (ref 0–200)
HDL: 27 mg/dL — ABNORMAL LOW (ref >40)
LDL CALC: UNDETERMINED mg/dL (ref 0–99)
TRIGLYCERIDES: 423 mg/dL — AB (ref <150)
VLDL: UNDETERMINED mg/dL (ref 0–40)

## 2015-11-30 LAB — BASIC METABOLIC PANEL
ANION GAP: 8 (ref 5–15)
BUN: 38 mg/dL — ABNORMAL HIGH (ref 6–20)
CO2: 23 mmol/L (ref 22–32)
Calcium: 9.2 mg/dL (ref 8.9–10.3)
Chloride: 108 mmol/L (ref 101–111)
Creatinine, Ser: 1.98 mg/dL — ABNORMAL HIGH (ref 0.61–1.24)
GFR calc Af Amer: 40 mL/min — ABNORMAL LOW (ref >60)
GFR, EST NON AFRICAN AMERICAN: 34 mL/min — AB (ref >60)
GLUCOSE: 186 mg/dL — AB (ref 65–99)
POTASSIUM: 3.9 mmol/L (ref 3.5–5.1)
Sodium: 139 mmol/L (ref 135–145)

## 2015-11-30 LAB — GLUCOSE, CAPILLARY
GLUCOSE-CAPILLARY: 197 mg/dL — AB (ref 65–99)
GLUCOSE-CAPILLARY: 155 mg/dL — AB (ref 65–99)
Glucose-Capillary: 189 mg/dL — ABNORMAL HIGH (ref 65–99)

## 2015-11-30 LAB — CBC
HEMATOCRIT: 39.8 % — AB (ref 40.0–52.0)
HEMOGLOBIN: 14.2 g/dL (ref 13.0–18.0)
MCH: 30.2 pg (ref 26.0–34.0)
MCHC: 35.6 g/dL (ref 32.0–36.0)
MCV: 84.6 fL (ref 80.0–100.0)
Platelets: 134 10*3/uL — ABNORMAL LOW (ref 150–440)
RBC: 4.71 MIL/uL (ref 4.40–5.90)
RDW: 12.8 % (ref 11.5–14.5)
WBC: 9 10*3/uL (ref 3.8–10.6)

## 2015-11-30 LAB — PROTIME-INR
INR: 1.01
Prothrombin Time: 13.5 seconds (ref 11.4–15.0)

## 2015-11-30 LAB — HEPARIN LEVEL (UNFRACTIONATED): Heparin Unfractionated: 0.41 IU/mL (ref 0.30–0.70)

## 2015-11-30 LAB — APTT: APTT: 29 s (ref 24–36)

## 2015-11-30 LAB — TROPONIN I

## 2015-11-30 SURGERY — CORONARY STENT INTERVENTION
Anesthesia: Moderate Sedation

## 2015-11-30 SURGERY — LEFT HEART CATH AND CORONARY ANGIOGRAPHY
Anesthesia: Moderate Sedation | Laterality: Left

## 2015-11-30 MED ORDER — ASPIRIN 81 MG PO CHEW
81.0000 mg | CHEWABLE_TABLET | Freq: Every day | ORAL | Status: DC
  Administered 2015-12-01: 81 mg via ORAL
  Filled 2015-11-30: qty 1

## 2015-11-30 MED ORDER — SODIUM CHLORIDE 0.9 % IV SOLN: 250.0000 mL | INTRAVENOUS | Status: DC | PRN

## 2015-11-30 MED ORDER — CLOPIDOGREL BISULFATE 75 MG PO TABS
ORAL_TABLET | ORAL | Status: AC
  Filled 2015-11-30: qty 4

## 2015-11-30 MED ORDER — MIDAZOLAM HCL 2 MG/2ML IJ SOLN
INTRAMUSCULAR | Status: AC
  Filled 2015-11-30: qty 2

## 2015-11-30 MED ORDER — HYDROMORPHONE HCL 1 MG/ML IJ SOLN
1.0000 mg | Freq: Once | INTRAMUSCULAR | Status: AC
  Administered 2015-11-30: 1 mg via INTRAVENOUS

## 2015-11-30 MED ORDER — FENTANYL CITRATE (PF) 100 MCG/2ML IJ SOLN
INTRAMUSCULAR | Status: AC
  Filled 2015-11-30: qty 2

## 2015-11-30 MED ORDER — TICAGRELOR 90 MG PO TABS
90.0000 mg | ORAL_TABLET | Freq: Two times a day (BID) | ORAL | Status: DC
  Administered 2015-12-01: 90 mg via ORAL
  Filled 2015-11-30: qty 1

## 2015-11-30 MED ORDER — SODIUM CHLORIDE 0.9 % IV SOLN: INTRAVENOUS | Status: DC

## 2015-11-30 MED ORDER — HEPARIN (PORCINE) IN NACL 2-0.9 UNIT/ML-% IJ SOLN
INTRAMUSCULAR | Status: AC
  Filled 2015-11-30: qty 1000

## 2015-11-30 MED ORDER — SODIUM BICARBONATE 8.4 % IV SOLN
INTRAVENOUS | Status: DC
  Filled 2015-11-30: qty 1000

## 2015-11-30 MED ORDER — BIVALIRUDIN 250 MG IV SOLR
250.0000 mg | INTRAVENOUS | Status: DC | PRN
  Administered 2015-11-30: 1.75 mg/kg/h via INTRAVENOUS

## 2015-11-30 MED ORDER — ATORVASTATIN CALCIUM 20 MG PO TABS
40.0000 mg | ORAL_TABLET | Freq: Every day | ORAL | Status: DC
  Administered 2015-11-30: 40 mg via ORAL
  Filled 2015-11-30: qty 2
  Filled 2015-11-30: qty 1

## 2015-11-30 MED ORDER — ONDANSETRON HCL 4 MG/2ML IJ SOLN
4.0000 mg | Freq: Four times a day (QID) | INTRAMUSCULAR | Status: DC | PRN
  Administered 2015-11-30: 4 mg via INTRAVENOUS

## 2015-11-30 MED ORDER — SODIUM BICARBONATE BOLUS VIA INFUSION
INTRAVENOUS | Status: DC
Start: 2015-12-01 — End: 2015-11-30
  Filled 2015-11-30: qty 1

## 2015-11-30 MED ORDER — FENTANYL CITRATE (PF) 100 MCG/2ML IJ SOLN
INTRAMUSCULAR | Status: DC | PRN
  Administered 2015-11-30: 25 ug via INTRAVENOUS

## 2015-11-30 MED ORDER — HYDROMORPHONE HCL 1 MG/ML IJ SOLN
INTRAMUSCULAR | Status: AC
  Filled 2015-11-30: qty 1

## 2015-11-30 MED ORDER — SODIUM CHLORIDE 0.9 % WEIGHT BASED INFUSION
3.0000 mL/kg/h | INTRAVENOUS | Status: AC
  Administered 2015-11-30: 3 mL/kg/h via INTRAVENOUS

## 2015-11-30 MED ORDER — HEPARIN (PORCINE) IN NACL 2-0.9 UNIT/ML-% IJ SOLN
INTRAMUSCULAR | Status: AC
  Filled 2015-11-30: qty 500

## 2015-11-30 MED ORDER — SODIUM BICARBONATE 8.4 % IV SOLN
INTRAVENOUS | Status: DC
  Filled 2015-11-30 (×2): qty 500

## 2015-11-30 MED ORDER — PROMETHAZINE HCL 25 MG/ML IJ SOLN
25.0000 mg | Freq: Four times a day (QID) | INTRAMUSCULAR | Status: DC | PRN
  Administered 2015-11-30: 25 mg via INTRAVENOUS
  Filled 2015-11-30: qty 1

## 2015-11-30 MED ORDER — IOPAMIDOL (ISOVUE-300) INJECTION 61%
INTRAVENOUS | Status: DC | PRN
  Administered 2015-11-30: 290 mL via INTRA_ARTERIAL

## 2015-11-30 MED ORDER — MIDAZOLAM HCL 2 MG/2ML IJ SOLN
INTRAMUSCULAR | Status: DC | PRN
  Administered 2015-11-30: 1 mg via INTRAVENOUS

## 2015-11-30 MED ORDER — SODIUM BICARBONATE 8.4 % IV SOLN
INTRAVENOUS | Status: DC
  Filled 2015-11-30: qty 500

## 2015-11-30 MED ORDER — FENTANYL CITRATE (PF) 100 MCG/2ML IJ SOLN
INTRAMUSCULAR | Status: DC | PRN
  Administered 2015-11-30: 50 ug via INTRAVENOUS

## 2015-11-30 MED ORDER — BIVALIRUDIN BOLUS VIA INFUSION - CUPID
INTRAVENOUS | Status: DC | PRN
  Administered 2015-11-30: 96.075 mg via INTRAVENOUS

## 2015-11-30 MED ORDER — CLOPIDOGREL BISULFATE 75 MG PO TABS
ORAL_TABLET | ORAL | Status: DC | PRN
  Administered 2015-11-30: 300 mg via ORAL

## 2015-11-30 MED ORDER — BIVALIRUDIN 250 MG IV SOLR
INTRAVENOUS | Status: AC
  Filled 2015-11-30: qty 250

## 2015-11-30 MED ORDER — IOPAMIDOL (ISOVUE-300) INJECTION 61%
INTRAVENOUS | Status: DC | PRN
  Administered 2015-11-30: 45 mL via INTRA_ARTERIAL

## 2015-11-30 MED ORDER — ACETAMINOPHEN 325 MG PO TABS: 650.0000 mg | ORAL_TABLET | ORAL | Status: DC | PRN

## 2015-11-30 MED ORDER — SODIUM CHLORIDE 0.9% FLUSH: 3.0000 mL | INTRAVENOUS | Status: DC | PRN

## 2015-11-30 MED ORDER — SODIUM CHLORIDE 0.9% FLUSH: 3.0000 mL | Freq: Two times a day (BID) | INTRAVENOUS | Status: DC

## 2015-11-30 MED ORDER — ASPIRIN 81 MG PO CHEW: 81.0000 mg | CHEWABLE_TABLET | ORAL | Status: DC

## 2015-11-30 MED ORDER — ONDANSETRON HCL 4 MG/2ML IJ SOLN
INTRAMUSCULAR | Status: AC
  Filled 2015-11-30: qty 2

## 2015-11-30 MED ORDER — NITROGLYCERIN 5 MG/ML IV SOLN
INTRAVENOUS | Status: AC
  Filled 2015-11-30: qty 10

## 2015-11-30 MED ORDER — SODIUM BICARBONATE BOLUS VIA INFUSION
INTRAVENOUS | Status: AC
  Administered 2015-11-30: 10:00:00 via INTRAVENOUS
  Filled 2015-11-30: qty 1

## 2015-11-30 MED ORDER — SODIUM CHLORIDE 0.9% FLUSH
3.0000 mL | Freq: Two times a day (BID) | INTRAVENOUS | Status: DC
  Administered 2015-12-01: 3 mL via INTRAVENOUS

## 2015-11-30 MED ORDER — MORPHINE SULFATE (PF) 2 MG/ML IV SOLN: 2.0000 mg | INTRAVENOUS | Status: DC | PRN

## 2015-11-30 SURGICAL SUPPLY — 13 items
BALLN TREK RX 2.5X20 (BALLOONS) ×3
BALLN ~~LOC~~ TREK RX 4.0X15 (BALLOONS) ×3
BALLOON TREK RX 2.5X20 (BALLOONS) ×1 IMPLANT
BALLOON ~~LOC~~ TREK RX 4.0X15 (BALLOONS) ×1 IMPLANT
CATH VISTA GUIDE 6FR JR4 (CATHETERS) ×3 IMPLANT
DEVICE CLOSURE MYNXGRIP 6/7F (Vascular Products) ×3 IMPLANT
DEVICE INFLAT 30 PLUS (MISCELLANEOUS) ×6 IMPLANT
KIT MANI 3VAL PERCEP (MISCELLANEOUS) ×3 IMPLANT
PACK CARDIAC CATH (CUSTOM PROCEDURE TRAY) ×3 IMPLANT
SHEATH AVANTI 6FR X 11CM (SHEATH) ×3 IMPLANT
STENT XIENCE ALPINE RX 3.0X23 (Permanent Stent) ×3 IMPLANT
WIRE EMERALD 3MM-J .035X150CM (WIRE) ×3 IMPLANT
WIRE G HI TQ BMW 190 (WIRE) ×3 IMPLANT

## 2015-11-30 SURGICAL SUPPLY — 10 items
CATH INFINITI 5FR ANG PIGTAIL (CATHETERS) ×3 IMPLANT
CATH INFINITI 5FR JL4 (CATHETERS) ×3 IMPLANT
CATH INFINITI JR4 5F (CATHETERS) ×3 IMPLANT
KIT MANI 3VAL PERCEP (MISCELLANEOUS) ×3 IMPLANT
KIT TRANSPAC II SGL 4260605 (MISCELLANEOUS) ×3 IMPLANT
NEEDLE PERC 18GX7CM (NEEDLE) ×3 IMPLANT
PACK CARDIAC CATH (CUSTOM PROCEDURE TRAY) ×3 IMPLANT
SHEATH PINNACLE 5F 10CM (SHEATH) ×3 IMPLANT
SUT SILK 0 FSL (SUTURE) ×3 IMPLANT
WIRE EMERALD 3MM-J .035X150CM (WIRE) ×3 IMPLANT

## 2015-11-30 NOTE — Progress Notes (Signed)
Returned from cath lab, no distress on ra. IVF infusing well rt fa.  Dressing to rt groin dry and intact.  VSS.  Denies need. CB in reach, SR up x 2.

## 2015-11-30 NOTE — Progress Notes (Signed)
Pt continues to complain of nausea, page out to Dr. Welton FlakesKhan, New orders received

## 2015-11-30 NOTE — Plan of Care (Signed)
Problem: Pain Managment: Goal: General experience of comfort will improve Outcome: Progressing Prn medications  Problem: Tissue Perfusion: Goal: Risk factors for ineffective tissue perfusion will decrease Outcome: Progressing Heparin gtt   

## 2015-11-30 NOTE — OR Nursing (Signed)
Pt vomited and given zofran iv 4 mg, with relief.

## 2015-11-30 NOTE — Progress Notes (Signed)
To specials via bed 

## 2015-11-30 NOTE — Progress Notes (Signed)
[employer] - [place] [facility]   PATIENT NAME: Wyatt [name]    MR#:  [id number]  DATE OF BIRTH:  03/10/1951  SUBJECTIVE:  CHIEF COMPLAINT:  No chief complaint on file.   Positive stress test recently in the office with chest pain.   Cardiac as was done today and stent was placed in distal RCA.   Seen in recovery room after his cardiac catheterization.  REVIEW OF SYSTEMS:  CONSTITUTIONAL: No fever, fatigue or weakness.  EYES: No blurred or double vision.  EARS, NOSE, AND THROAT: No tinnitus or ear pain.  RESPIRATORY: No cough, shortness of breath, wheezing or hemoptysis.  CARDIOVASCULAR: No chest pain, orthopnea, edema.  GASTROINTESTINAL: No nausea, vomiting, diarrhea or abdominal pain.  GENITOURINARY: No dysuria, hematuria.  ENDOCRINE: No polyuria, nocturia,  HEMATOLOGY: No anemia, easy bruising or bleeding SKIN: No rash or lesion. MUSCULOSKELETAL: No joint pain or arthritis.   NEUROLOGIC: No tingling, numbness, weakness.  PSYCHIATRY: No anxiety or depression.   ROS  DRUG ALLERGIES:  No Known Allergies  VITALS:  Blood pressure 134/83, pulse 78, temperature 97.8 F (36.6 C), temperature source Oral, resp. rate 13, height 6\' 2"  (1.88 m), weight 128.141 kg (282 lb 8 oz), SpO2 93 %.  PHYSICAL EXAMINATION:  GENERAL:  65 y.o.-year-old patient lying in the bed with no acute distress.  EYES: Pupils equal, round, reactive to light and accommodation. No scleral icterus. Extraocular muscles intact.  HEENT: Head atraumatic, normocephalic. Oropharynx and nasopharynx clear.  NECK:  Supple, no jugular venous distention. No thyroid enlargement, no tenderness.  LUNGS: Normal breath sounds bilaterally, no wheezing, rales,rhonchi or crepitation. No use of accessory muscles of respiration.  CARDIOVASCULAR: S1, S2 normal. No murmurs, rubs, or gallops.  ABDOMEN: Soft, nontender, nondistended. Bowel sounds present. No organomegaly or mass.  EXTREMITIES: No pedal edema,  cyanosis, or clubbing.  NEUROLOGIC: Cranial nerves II through XII are intact. Muscle strength 5/5 in all extremities. Sensation intact. Gait not checked.  PSYCHIATRIC: The patient is alert and oriented x 3.  SKIN: No obvious rash, lesion, or ulcer.   Physical Exam LABORATORY PANEL:   CBC  Recent Labs Lab 11/30/15 0134  WBC 9.0  HGB 14.2  HCT 39.8*  PLT 134*   ------------------------------------------------------------------------------------------------------------------  Chemistries   Recent Labs Lab 11/29/15 1644 11/30/15 0134  NA 138 139  K 4.3 3.9  CL 107 108  CO2 23 23  GLUCOSE 207* 186*  BUN 43* 38*  CREATININE 2.07* 1.98*  CALCIUM 9.5 9.2  AST 22  --   ALT 24  --   ALKPHOS 49  --   BILITOT 0.3  --    ------------------------------------------------------------------------------------------------------------------  Cardiac Enzymes  Recent Labs Lab 11/29/15 2047 11/29/15 2353  TROPONINI <0.03 <0.03   ------------------------------------------------------------------------------------------------------------------  RADIOLOGY:  Dg Chest 1 View  11/29/2015  CLINICAL DATA:  Rapid heart rate. Chest pressure. Known history of coronary artery disease, diabetes, hypertension, hyperlipidemia. EXAM: CHEST 1 VIEW COMPARISON:  None. FINDINGS: Mild cardiomegaly. Lungs are clear. No evidence of pneumonia. No pleural effusion or pneumothorax seen. Osseous structures about the chest are unremarkable. IMPRESSION: Mild cardiomegaly.  No active disease. Electronically Signed   By: Bary RichardStan  Maynard [name].   On: 11/29/2015 18:07    ASSESSMENT AND PLAN:   Active Problems:   Unstable angina (HCC)  1. Angina with CAD.  Given IV heparin. Continue aspirin and Plavix. Lower the dose of the beta blocker since that was just started on Friday.   Cath done- distal RCA stent  placed 11/30/15. 2. Essential hypertension. Decrease the dose of Toprol and hold Diovan HCT 3. Type 2 diabetes  mellitus hold diabetic medications and put on sliding scale. 4. Hyperlipidemia unspecified on Lipitor. Check a lipid profile in the a.m. 5. Chronic kidney disease stage III. Give IV fluid hydration prior to cardiac catheter 6. Gout. Continue renally dosed medication 7. Back pain when he lies down. Patient already on medication for gastroesophageal reflux disease. This does not seem to be an aortic dissection because he is not describing the pain in this way. Continue to monitor closely.    All the records are reviewed and case discussed with Care Management/Social Workerr. Management plans discussed with the patient, family and they are in agreement.  CODE STATUS: full  TOTAL TIME TAKING CARE OF THIS PATIENT: 35 minutes.     POSSIBLE D/C IN 1-2 DAYS, DEPENDING ON CLINICAL CONDITION.   Altamese DillingVACHHANI, Wyatt [name] on 11/30/2015   Between 7am to 6pm - Pager - [phone]  After 6pm go to [email or web address] - password [email or web address]  [facility] [place] [facility]  Office  [phone]  CC: Primary care physician; Wyatt [name],JOHN, Wyatt [name]  Note: This dictation was prepared with Dragon dictation along with smaller phrase technology. Any transcriptional errors that result from this process are unintentional.

## 2015-11-30 NOTE — Progress Notes (Signed)
Wyatt [name] is a 65 y.o. male  [id number]  Primary Cardiologist: Adrian BlackwaterShaukat Runette [name] Reason for Consultation: Chest pain  HPI: This is a 65 year old white male with a past medical history of PCI and stenting about 6 weeks ago at [facility] mid LAD presented to the hospital with recurrent chest pain associated with shortness of breath and diaphoresis. Patient states that the he was fine until he saw me Friday and was prescribed metoprolol succinate 100 mg by mouth once a day because of tachycardia and started having chest pain on Saturday and Sunday. He felt like he was going to pass out. He underwent a nuclear stress test in the office yesterday which showed anterior apical and inferior wall reversible defect with LVEF 50% and echocardiogram also revealed EF of 50% with inferior hypokinesis. He still has pressure in his chest. But for some reason his creatinine is elevated.   Review of Systems: No syncope but feels dizzy and short of breath.   Past Medical History  Diagnosis Date  . Hypertension   . Anginal pain (HCC)   . Diabetes mellitus without complication (HCC)   . High cholesterol   . Shortness of breath dyspnea   . Enlarged prostate   . Chronic kidney disease     Medications Prior to Admission  Medication Sig Dispense Refill  . allopurinol (ZYLOPRIM) 100 MG tablet Take 100 mg by mouth daily.    Marland Kitchen. aspirin EC 81 MG tablet Take 81 mg by mouth daily.    Marland Kitchen. atorvastatin (LIPITOR) 10 MG tablet Take 10 mg by mouth at bedtime.    . celecoxib (CELEBREX) 200 MG capsule Take 200 mg by mouth at bedtime.    . Cinnamon 500 MG TABS Take 500 mg by mouth daily.    . clopidogrel (PLAVIX) 75 MG tablet Take 75 mg by mouth daily.    . colchicine 0.6 MG tablet Take 0.6 mg by mouth daily.    Marland Kitchen. doxazosin (CARDURA) 4 MG tablet Take 4 mg by mouth at bedtime.    Marland Kitchen. glyBURIDE-metformin (GLUCOVANCE) 5-500 MG tablet Take 1 tablet by mouth 2 (two) times daily with a meal.    .  metoprolol succinate (TOPROL-XL) 100 MG 24 hr tablet Take 100 mg by mouth daily.     . Multiple Vitamin (MULTIVITAMIN WITH MINERALS) TABS tablet Take 1 tablet by mouth daily.    . niacin (NIASPAN) 1000 MG CR tablet Take 1,000 mg by mouth at bedtime.    . nitroGLYCERIN (NITROSTAT) 0.4 MG SL tablet Place 0.4 mg under the tongue every 5 (five) minutes as needed for chest pain.    Marland Kitchen. omega-3 acid ethyl esters (LOVAZA) 1 g capsule Take 1 g by mouth daily.    . pantoprazole (PROTONIX) 40 MG tablet Take 40 mg by mouth 2 (two) times daily.    . traMADol (ULTRAM) 50 MG tablet Take 50 mg by mouth every 6 (six) hours as needed for moderate pain.    . valsartan-hydrochlorothiazide (DIOVAN-HCT) 320-12.5 MG tablet Take 1 tablet by mouth daily.       Wyatt [name]. [MAR Hold] allopurinol  100 mg Oral Daily  . [MAR Hold] aspirin EC  81 mg Oral Daily  . [MAR Hold] atorvastatin  10 mg Oral QHS  . [MAR Hold] clopidogrel  75 mg Oral Daily  . [MAR Hold] colchicine  0.6 mg Oral Daily  . [MAR Hold] doxazosin  4 mg Oral QHS  . [MAR Hold] insulin aspart  0-5  Units Subcutaneous QHS  . [MAR Hold] insulin aspart  0-9 Units Subcutaneous TID WC  . [MAR Hold] metoprolol succinate  25 mg Oral Daily  . [MAR Hold] multivitamin with minerals  1 tablet Oral Daily  . [MAR Hold] niacin  1,000 mg Oral QHS  . [MAR Hold] omega-3 acid ethyl esters  1 g Oral Daily  . [MAR Hold] pantoprazole  40 mg Oral BID  . [MAR Hold] sodium chloride flush  3 mL Intravenous Q12H    Infusions: . sodium chloride 150 mL/hr at 11/30/15 0418  . sodium chloride    . heparin 1,500 Units/hr (11/30/15 1610)    No Known Allergies  Social History   Social History  . Marital Status: Single    Spouse Name: N/A  . Number of Children: N/A  . Years of Education: N/A   Occupational History  . Not on file.   Social History Main Topics  . Smoking status: Former Smoker -- 0.00 packs/day for 0 years    Quit date: 07/29/1969  . Smokeless tobacco: Former Neurosurgeon   . Alcohol Use: 1.2 oz/week    2 Cans of beer per week  . Drug Use: No  . Sexual Activity: Not on file   Other Topics Concern  . Not on file   Social History Narrative    Family History  Problem Relation Age of Onset  . CAD Mother   . Dementia Mother   . Parkinson's disease Mother   . CAD Father     PHYSICAL EXAM: Filed Vitals:   11/30/15 0730 11/30/15 0804  BP: 149/74 157/95  Pulse: 77 82  Temp: 97.7 F (36.5 C) 97.8 F (36.6 C)  Resp: 17 14     Intake/Output Summary (Last 24 hours) at 11/30/15 0836 Last data filed at 11/30/15 0733  Gross per 24 hour  Intake 1514.16 ml  Output   1450 ml  Net  64.16 ml    General:  Well appearing. No respiratory difficulty HEENT: normal Neck: supple. no JVD. Carotids 2+ bilat; no bruits. No lymphadenopathy or thryomegaly appreciated. Cor: PMI nondisplaced. Regular rate & rhythm. No rubs, gallops or murmurs. Lungs: clear Abdomen: soft, nontender, nondistended. No hepatosplenomegaly. No bruits or masses. Good bowel sounds. Extremities: no cyanosis, clubbing, rash, edema Neuro: alert & oriented x 3, cranial nerves grossly intact. moves all 4 extremities w/o difficulty. Affect pleasant.  RUE:AVWUJW sinus rhythm nonspecific ST-T changes  Results for orders placed or performed during the hospital encounter of 11/29/15 (from the past 24 hour(s))  CBC     Status: Abnormal   Collection Time: 11/29/15  4:44 PM  Result Value Ref Range   WBC 8.3 3.8 - 10.6 K/uL   RBC 4.67 4.40 - 5.90 MIL/uL   Hemoglobin 14.1 13.0 - 18.0 g/dL   HCT 11.9 (L) 14.7 - 82.9 %   MCV 83.9 80.0 - 100.0 fL   MCH 30.1 26.0 - 34.0 pg   MCHC 35.9 32.0 - 36.0 g/dL   RDW 56.2 13.0 - 86.5 %   Platelets 147 (L) 150 - 440 K/uL  Comprehensive metabolic panel     Status: Abnormal   Collection Time: 11/29/15  4:44 PM  Result Value Ref Range   Sodium 138 135 - 145 mmol/L   Potassium 4.3 3.5 - 5.1 mmol/L   Chloride 107 101 - 111 mmol/L   CO2 23 22 - 32 mmol/L    Glucose, Bld 207 (H) 65 - 99 mg/dL   BUN 43 (H) 6 -  20 mg/dL   Creatinine, Ser 5.62 (H) 0.61 - 1.24 mg/dL   Calcium 9.5 8.9 - 13.0 mg/dL   Total Protein 7.3 6.5 - 8.1 g/dL   Albumin 4.3 3.5 - 5.0 g/dL   AST 22 15 - 41 U/L   ALT 24 17 - 63 U/L   Alkaline Phosphatase 49 38 - 126 U/L   Total Bilirubin 0.3 0.3 - 1.2 mg/dL   GFR calc non Af Amer 33 (L) >60 mL/min   GFR calc Af Amer 38 (L) >60 mL/min   Anion gap 8 5 - 15  Protime-INR     Status: None   Collection Time: 11/29/15  4:44 PM  Result Value Ref Range   Prothrombin Time 13.5 11.4 - 15.0 seconds   INR 1.01   APTT     Status: None   Collection Time: 11/29/15  4:44 PM  Result Value Ref Range   aPTT 29 24 - 36 seconds  Troponin I     Status: None   Collection Time: 11/29/15  4:44 PM  Result Value Ref Range   Troponin I <0.03 <0.031 ng/mL  Troponin I     Status: None   Collection Time: 11/29/15  8:47 PM  Result Value Ref Range   Troponin I <0.03 <0.031 ng/mL  Glucose, capillary     Status: Abnormal   Collection Time: 11/29/15  9:08 PM  Result Value Ref Range   Glucose-Capillary 161 (H) 65 - 99 mg/dL  Troponin I     Status: None   Collection Time: 11/29/15 11:53 PM  Result Value Ref Range   Troponin I <0.03 <0.03 ng/mL  Basic metabolic panel     Status: Abnormal   Collection Time: 11/30/15  1:34 AM  Result Value Ref Range   Sodium 139 135 - 145 mmol/L   Potassium 3.9 3.5 - 5.1 mmol/L   Chloride 108 101 - 111 mmol/L   CO2 23 22 - 32 mmol/L   Glucose, Bld 186 (H) 65 - 99 mg/dL   BUN 38 (H) 6 - 20 mg/dL   Creatinine, Ser 8.65 (H) 0.61 - 1.24 mg/dL   Calcium 9.2 8.9 - 78.4 mg/dL   GFR calc non Af Amer 34 (L) >60 mL/min   GFR calc Af Amer 40 (L) >60 mL/min   Anion gap 8 5 - 15  CBC     Status: Abnormal   Collection Time: 11/30/15  1:34 AM  Result Value Ref Range   WBC 9.0 3.8 - 10.6 K/uL   RBC 4.71 4.40 - 5.90 MIL/uL   Hemoglobin 14.2 13.0 - 18.0 g/dL   HCT 69.6 (L) 29.5 - 28.4 %   MCV 84.6 80.0 - 100.0 fL   MCH  30.2 26.0 - 34.0 pg   MCHC 35.6 32.0 - 36.0 g/dL   RDW 13.2 44.0 - 10.2 %   Platelets 134 (L) 150 - 440 K/uL  Lipid panel     Status: Abnormal   Collection Time: 11/30/15  1:34 AM  Result Value Ref Range   Cholesterol 106 0 - 200 mg/dL   Triglycerides 725 (H) <150 mg/dL   HDL 27 (L) >36 mg/dL   Total CHOL/HDL Ratio 3.9 RATIO   VLDL UNABLE TO REPORT DUE TO LIPEMIC INTERFERENCE 0 - 40 mg/dL   LDL Cholesterol UNABLE TO REPORT DUE TO LIPEMIC INTERFERENCE 0 - 99 mg/dL  Heparin level (unfractionated)     Status: None   Collection Time: 11/30/15  1:34 AM  Result Value Ref  Range   Heparin Unfractionated 0.41 0.30 - 0.70 IU/mL  Glucose, capillary     Status: Abnormal   Collection Time: 11/30/15  7:21 AM  Result Value Ref Range   Glucose-Capillary 197 (H) 65 - 99 mg/dL   Comment 1 Notify RN    Dg Chest 1 View  11/29/2015  CLINICAL DATA:  Rapid heart rate. Chest pressure. Known history of coronary artery disease, diabetes, hypertension, hyperlipidemia. EXAM: CHEST 1 VIEW COMPARISON:  None. FINDINGS: Mild cardiomegaly. Lungs are clear. No evidence of pneumonia. No pleural effusion or pneumothorax seen. Osseous structures about the chest are unremarkable. IMPRESSION: Mild cardiomegaly.  No active disease. Electronically Signed   By: Bary RichardStan  Maynard M.D.   On: 11/29/2015 18:07     ASSESSMENT AND PLAN: Unstable angina with history of coronary artery disease status post PCI and stenting of the LAD on 07/30/2015. Now having chest pain at rest and plan on doing cardiac catheterization since stress test is abnormal.  Jonte Wollam A

## 2015-11-30 NOTE — Progress Notes (Signed)
ANTICOAGULATION CONSULT NOTE - Initial Consult  Pharmacy Consult for Heparin  Indication: chest pain/ACS  No Known Allergies  Patient Measurements: Height: 6\' 2"  (188 cm) (stated) Weight: 282 lb 8 oz (128.141 kg) IBW/kg (Calculated) : 82.2 Heparin Dosing Weight: 110.4 kg   Vital Signs: Temp: 98 F (36.7 C) (06/26 2007) Temp Source: Oral (06/26 2007) BP: 130/61 mmHg (06/26 2007) Pulse Rate: 79 (06/26 2007)  Labs:  Recent Labs  11/29/15 1644 11/29/15 2047 11/29/15 2353 11/30/15 0134  HGB 14.1  --   --  14.2  HCT 39.2*  --   --  39.8*  PLT 147*  --   --  134*  APTT 29  --   --   --   LABPROT 13.5  --   --   --   INR 1.01  --   --   --   HEPARINUNFRC  --   --   --  0.41  CREATININE 2.07*  --   --  1.98*  TROPONINI <0.03 <0.03 <0.03  --     Estimated Creatinine Clearance: 55 mL/min (by C-G formula based on Cr of 1.98).   Medical History: Past Medical History  Diagnosis Date  . Hypertension   . Anginal pain (HCC)   . Diabetes mellitus without complication (HCC)   . High cholesterol   . Shortness of breath dyspnea   . Enlarged prostate   . Chronic kidney disease     Medications:  Prescriptions prior to admission  Medication Sig Dispense Refill Last Dose  . allopurinol (ZYLOPRIM) 100 MG tablet Take 100 mg by mouth daily.   11/29/2015 at Unknown time  . aspirin EC 81 MG tablet Take 81 mg by mouth daily.   11/29/2015 at 0700  . atorvastatin (LIPITOR) 10 MG tablet Take 10 mg by mouth at bedtime.   11/28/2015 at Unknown time  . celecoxib (CELEBREX) 200 MG capsule Take 200 mg by mouth at bedtime.   11/28/2015 at Unknown time  . Cinnamon 500 MG TABS Take 500 mg by mouth daily.   11/29/2015 at Unknown time  . clopidogrel (PLAVIX) 75 MG tablet Take 75 mg by mouth daily.   11/29/2015 at 0700  . colchicine 0.6 MG tablet Take 0.6 mg by mouth daily.   11/29/2015 at Unknown time  . doxazosin (CARDURA) 4 MG tablet Take 4 mg by mouth at bedtime.   11/28/2015 at Unknown time  .  glyBURIDE-metformin (GLUCOVANCE) 5-500 MG tablet Take 1 tablet by mouth 2 (two) times daily with a meal.   11/29/2015 at Unknown time  . metoprolol succinate (TOPROL-XL) 100 MG 24 hr tablet Take 100 mg by mouth daily.    11/29/2015 at 0700  . Multiple Vitamin (MULTIVITAMIN WITH MINERALS) TABS tablet Take 1 tablet by mouth daily.   11/29/2015 at Unknown time  . niacin (NIASPAN) 1000 MG CR tablet Take 1,000 mg by mouth at bedtime.   11/28/2015 at Unknown time  . nitroGLYCERIN (NITROSTAT) 0.4 MG SL tablet Place 0.4 mg under the tongue every 5 (five) minutes as needed for chest pain.   Past Month at Unknown time  . omega-3 acid ethyl esters (LOVAZA) 1 g capsule Take 1 g by mouth daily.   11/29/2015 at Unknown time  . pantoprazole (PROTONIX) 40 MG tablet Take 40 mg by mouth 2 (two) times daily.   11/29/2015 at Unknown time  . traMADol (ULTRAM) 50 MG tablet Take 50 mg by mouth every 6 (six) hours as needed for moderate pain.  Past Week at Unknown time  . valsartan-hydrochlorothiazide (DIOVAN-HCT) 320-12.5 MG tablet Take 1 tablet by mouth daily.   11/29/2015 at Unknown time    Assessment: CrCl =  Pharmacy consulted to dose heparin in this 65 year old male admitted with ACS.   Goal of Therapy:  Heparin level 0.3-0.7 units/ml Monitor platelets by anticoagulation protocol: Yes   Plan:  Give 4000 units bolus x 1 Start heparin infusion at 1500 units/hr Check anti-Xa level in 6 hours and daily while on heparin Continue to monitor H&H and platelets   6/27 01:30 heparin level 0.41. Recheck in 6 hours to confirm.  Currie Dennin S 11/30/2015,4:56 AM

## 2015-12-01 ENCOUNTER — Encounter: Payer: Self-pay | Admitting: Internal Medicine

## 2015-12-01 DIAGNOSIS — I25110 Atherosclerotic heart disease of native coronary artery with unstable angina pectoris: Secondary | ICD-10-CM | POA: Diagnosis not present

## 2015-12-01 LAB — BASIC METABOLIC PANEL
Anion gap: 5 (ref 5–15)
BUN: 22 mg/dL — AB (ref 6–20)
CALCIUM: 9.6 mg/dL (ref 8.9–10.3)
CO2: 27 mmol/L (ref 22–32)
CREATININE: 1.57 mg/dL — AB (ref 0.61–1.24)
Chloride: 107 mmol/L (ref 101–111)
GFR calc non Af Amer: 46 mL/min — ABNORMAL LOW (ref >60)
GFR, EST AFRICAN AMERICAN: 53 mL/min — AB (ref >60)
Glucose, Bld: 152 mg/dL — ABNORMAL HIGH (ref 65–99)
Potassium: 4.9 mmol/L (ref 3.5–5.1)
SODIUM: 139 mmol/L (ref 135–145)

## 2015-12-01 LAB — GLUCOSE, CAPILLARY
GLUCOSE-CAPILLARY: 153 mg/dL — AB (ref 65–99)
Glucose-Capillary: 238 mg/dL — ABNORMAL HIGH (ref 65–99)

## 2015-12-01 MED ORDER — METOPROLOL SUCCINATE ER 25 MG PO TB24: 25.0000 mg | ORAL_TABLET | Freq: Every day | ORAL | Status: DC

## 2015-12-01 MED ORDER — TICAGRELOR 90 MG PO TABS: 90.0000 mg | ORAL_TABLET | Freq: Two times a day (BID) | ORAL | Status: DC

## 2015-12-01 MED ORDER — ATORVASTATIN CALCIUM 40 MG PO TABS: 40.0000 mg | ORAL_TABLET | Freq: Every day | ORAL | Status: DC

## 2015-12-01 NOTE — Progress Notes (Signed)
  SUBJECTIVE: Mr. Wyatt [name] is feeling well this morning. He is breathing much better since his stent placement and has no chest pressure or pain.   Filed Vitals:   11/30/15 1651 11/30/15 1931 12/01/15 0505 12/01/15 0915  BP: 126/70 118/60 107/62 123/64  Pulse: 58 75 68 83  Temp: 98.6 F (37 C) 97.8 F (36.6 C) 97.7 F (36.5 C) 98.6 F (37 C)  TempSrc: Oral  Oral Oral  Resp: 16 20 20 18   Height:      Weight:      SpO2: 99% 96% 96% 95%    Intake/Output Summary (Last 24 hours) at 12/01/15 1020 Last data filed at 12/01/15 0947  Gross per 24 hour  Intake 1383.11 ml  Output   1750 ml  Net -366.89 ml    LABS: Basic Metabolic Panel:  Recent Labs  16/03/9605/26/17 1644 11/30/15 0134  NA 138 139  K 4.3 3.9  CL 107 108  CO2 23 23  GLUCOSE 207* 186*  BUN 43* 38*  CREATININE 2.07* 1.98*  CALCIUM 9.5 9.2   Liver Function Tests:  Recent Labs  11/29/15 1644  AST 22  ALT 24  ALKPHOS 49  BILITOT 0.3  PROT 7.3  ALBUMIN 4.3   No results for input(s): LIPASE, AMYLASE in the last 72 hours. CBC:  Recent Labs  11/29/15 1644 11/30/15 0134  WBC 8.3 9.0  HGB 14.1 14.2  HCT 39.2* 39.8*  MCV 83.9 84.6  PLT 147* 134*   Cardiac Enzymes:  Recent Labs  11/29/15 1644 11/29/15 2047 11/29/15 2353  TROPONINI <0.03 <0.03 <0.03   BNP: Invalid input(s): POCBNP D-Dimer: No results for input(s): DDIMER in the last 72 hours. Hemoglobin A1C: No results for input(s): HGBA1C in the last 72 hours. Fasting Lipid Panel:  Recent Labs  11/30/15 0134  CHOL 106  HDL 27*  LDLCALC UNABLE TO REPORT DUE TO LIPEMIC INTERFERENCE  TRIG 423*  CHOLHDL 3.9   Thyroid Function Tests: No results for input(s): TSH, T4TOTAL, T3FREE, THYROIDAB in the last 72 hours.  Invalid input(s): FREET3 Anemia Panel: No results for input(s): VITAMINB12, FOLATE, FERRITIN, TIBC, IRON, RETICCTPCT in the last 72 hours.   PHYSICAL EXAM General: Well developed, well nourished, in no acute distress HEENT:   Normocephalic and atramatic Neck:  No JVD.  Lungs: Clear bilaterally to auscultation and percussion. Heart: HRRR . Normal S1 and S2 without gallops or murmurs.  Abdomen: Bowel sounds are positive, abdomen soft and non-tender  Msk:  Back normal, normal gait. Normal strength and tone for age. Extremities: No clubbing, cyanosis or edema.  Right groin is without hematoma, edema, drainage, or bruit. Pedal pulses are present and strong bilaterally Neuro: Alert and oriented X 3. Psych:  Good affect, responds appropriately  TELEMETRY: Sinus rhythm with occasional PVCs, 75 bpm  ASSESSMENT AND PLAN: Status post drug-eluting stent to distal RCA to treat 70% lesion. Patient also has recent stent placement on 07/30/2015 to the distal LAD. The patient's breathing and chest pressure has greatly improved after stent placement. His right groin is within normal limits. And he is ready to be discharged on dual antiplatelet therapy with Brilinta and aspirin. He is on a statin.  Medical therapy as well as lifestyle changes discussed with the patient to reduce future risk of cardiac events. Will follow-up in the office at [facility] on Friday, June 30 at 10:00  Active Problems:   Unstable angina [name](HCC)    Berton BonJanine Jaylon Boylen, NP 12/01/2015 10:20 AM

## 2015-12-01 NOTE — Care Management (Signed)
patient confirms he has drug coverage with his [employer].  Provided patient with Brilinta coupon

## 2015-12-01 NOTE — Progress Notes (Signed)
Patient is discharge home in a stable condition, summary and site care given verbalized understanding , left with daughter

## 2015-12-01 NOTE — Discharge Summary (Signed)
[facility] - [place] at [facility]   PATIENT NAME: Wyatt [name]    MR#:  [id number]  DATE OF BIRTH:  11/21/1950  DATE OF ADMISSION:  11/29/2015 ADMITTING PHYSICIAN: Alford Highlandichard Wieting, MD  DATE OF DISCHARGE: No discharge date for patient encounter.  PRIMARY CARE PHYSICIAN: HINSON,JOHN, MD    ADMISSION DIAGNOSIS:  [place], positive stress test Unstable angina Pt to be admitted  Coronary Artery Disease Coronary Artery Disease  DISCHARGE DIAGNOSIS:  Active Problems:   Unstable angina (HCC)    CAD- stent placed.  SECONDARY DIAGNOSIS:   Past Medical History  Diagnosis Date  . Hypertension   . Anginal pain (HCC)   . Diabetes mellitus without complication (HCC)   . High cholesterol   . Shortness of breath dyspnea   . Enlarged prostate   . Chronic kidney disease     HOSPITAL COURSE:   1. Angina with CAD.  Given IV heparin. Continue aspirin and Plavix. Lower the dose of the beta blocker since that was just started on Friday.  Cath done- distal RCA stent placed 11/30/15.   Brilanta and ASA on discharge. 2. Essential hypertension. Decrease the dose of Toprol and hold Diovan HCT - under control. 3. Type 2 diabetes mellitus hold diabetic medications and put on sliding scale. 4. Hyperlipidemia unspecified on Lipitor. Check a lipid profile in the a.m. 5. Chronic kidney disease stage III. Give IV fluid hydration prior to cardiac catheter 6. Gout. Continue renally dosed medication 7. Back pain when he lies down. Patient already on medication for gastroesophageal reflux disease. This does not seem to be an aortic dissection because he is not describing the pain in this way. Continue to monitor closely.  DISCHARGE CONDITIONS:   Stable.  CONSULTS OBTAINED:  Treatment Team:  Laurier NancyShaukat A Khan, MD  DRUG ALLERGIES:  No Known Allergies  DISCHARGE MEDICATIONS:   Current Discharge Medication List    START taking these medications   Details  ticagrelor  (BRILINTA) 90 MG TABS tablet Take 1 tablet (90 mg total) by mouth 2 (two) times daily. Qty: 60 tablet, Refills: 0      CONTINUE these medications which have CHANGED   Details  atorvastatin (LIPITOR) 40 MG tablet Take 1 tablet (40 mg total) by mouth at bedtime. Qty: 30 tablet, Refills: 0    metoprolol succinate (TOPROL-XL) 25 MG 24 hr tablet Take 1 tablet (25 mg total) by mouth daily. Qty: 30 tablet, Refills: 0      CONTINUE these medications which have NOT CHANGED   Details  allopurinol (ZYLOPRIM) 100 MG tablet Take 100 mg by mouth daily.    aspirin EC 81 MG tablet Take 81 mg by mouth daily.    celecoxib (CELEBREX) 200 MG capsule Take 200 mg by mouth at bedtime.    Cinnamon 500 MG TABS Take 500 mg by mouth daily.    colchicine 0.6 MG tablet Take 0.6 mg by mouth daily.    doxazosin (CARDURA) 4 MG tablet Take 4 mg by mouth at bedtime.    glyBURIDE-metformin (GLUCOVANCE) 5-500 MG tablet Take 1 tablet by mouth 2 (two) times daily with a meal.    Multiple Vitamin (MULTIVITAMIN WITH MINERALS) TABS tablet Take 1 tablet by mouth daily.    niacin (NIASPAN) 1000 MG CR tablet Take 1,000 mg by mouth at bedtime.    nitroGLYCERIN (NITROSTAT) 0.4 MG SL tablet Place 0.4 mg under the tongue every 5 (five) minutes as needed for chest pain.    omega-3 acid ethyl esters (  LOVAZA) 1 g capsule Take 1 g by mouth daily.    pantoprazole (PROTONIX) 40 MG tablet Take 40 mg by mouth 2 (two) times daily.    traMADol (ULTRAM) 50 MG tablet Take 50 mg by mouth every 6 (six) hours as needed for moderate pain.      STOP taking these medications     clopidogrel (PLAVIX) 75 MG tablet      valsartan-hydrochlorothiazide (DIOVAN-HCT) 320-12.5 MG tablet          DISCHARGE INSTRUCTIONS:    Follow with cardiology clinic in 2 days as advised.  If you experience worsening of your admission symptoms, develop shortness of breath, life threatening emergency, suicidal or homicidal thoughts you must seek  medical attention immediately by calling 911 or calling your MD immediately  if symptoms less severe.  You Must read complete instructions/literature along with all the possible adverse reactions/side effects for all the Medicines you take and that have been prescribed to you. Take any new Medicines after you have completely understood and accept all the possible adverse reactions/side effects.   Please note  You were cared for by a hospitalist during your hospital stay. If you have any questions about your discharge medications or the care you received while you were in the hospital after you are discharged, you can call the unit and asked to speak with the hospitalist on call if the hospitalist that took care of you is not available. Once you are discharged, your primary care physician will handle any further medical issues. Please note that NO REFILLS for any discharge medications will be authorized once you are discharged, as it is imperative that you return to your primary care physician (or establish a relationship with a primary care physician if you do not have one) for your aftercare needs so that they can reassess your need for medications and monitor your lab values.    Today   CHIEF COMPLAINT:  No chief complaint on file.   HISTORY OF PRESENT ILLNESS:  Wyatt [name]  is a 65 y.o. male with a known history of CAD, diabetes, hypertension, hyperlipidemia. On Friday he was started on metoprolol because of rapid heart rate. He hasn't felt well since. On Saturday he had a couple episodes of chest pressure when he was walking up a hill. He had a rest for 30 minutes. The pressure was in the center of his chest felt like it was caving in. Associated with some sweating and nausea And shortness of breath. Today he saw Dr. Welton Flakes in the office and he was on the treadmill and got chest pain. They gave him 2 nitroglycerin sprays which helped. The stress test was read as abnormal and he was sent into the  hospital for direct admission and the plan is for cardiac catheterization in the morning.   VITAL SIGNS:  Blood pressure 127/57, pulse 79, temperature 98.2 F (36.8 C), temperature source Oral, resp. rate 18, height  (1.88 m), weight 128.141 kg (282 lb 8 oz), SpO2 97 %.  I/O:   Intake/Output Summary (Last 24 hours) at 12/01/15 1345 Last data filed at 12/01/15 0947  Gross per 24 hour  Intake 1383.11 ml  Output   1550 ml  Net -166.89 ml    PHYSICAL EXAMINATION:  GENERAL:  65 y.o.-year-old patient lying in the bed with no acute distress.  EYES: Pupils equal, round, reactive to light and accommodation. No scleral icterus. Extraocular muscles intact.  HEENT: Head atraumatic, normocephalic. Oropharynx and nasopharynx clear.  NECK:  Supple, no jugular venous distention. No thyroid enlargement, no tenderness.  LUNGS: Normal breath sounds bilaterally, no wheezing, rales,rhonchi or crepitation. No use of accessory muscles of respiration.  CARDIOVASCULAR: S1, S2 normal. No murmurs, rubs, or gallops.  ABDOMEN: Soft, non-tender, non-distended. Bowel sounds present. No organomegaly or mass.  EXTREMITIES: No pedal edema, cyanosis, or clubbing.  NEUROLOGIC: Cranial nerves II through XII are intact. Muscle strength 5/5 in all extremities. Sensation intact. Gait not checked.  PSYCHIATRIC: The patient is alert and oriented x 3.  SKIN: No obvious rash, lesion, or ulcer.   DATA REVIEW:   CBC  Recent Labs Lab 11/30/15 0134  WBC 9.0  HGB 14.2  HCT 39.8*  PLT 134*    Chemistries   Recent Labs Lab 11/29/15 1644  12/01/15 1307  NA 138  < > 139  K 4.3  < > 4.9  CL 107  < > 107  CO2 23  < > 27  GLUCOSE 207*  < > 152*  BUN 43*  < > 22*  CREATININE 2.07*  < > 1.57*  CALCIUM 9.5  < > 9.6  AST 22  --   --   ALT 24  --   --   ALKPHOS 49  --   --   BILITOT 0.3  --   --   < > = values in this interval not displayed.  Cardiac Enzymes  Recent Labs Lab 11/29/15 2353  TROPONINI  <0.03    Microbiology Results  No results found for this or any previous visit.  RADIOLOGY:  Dg Chest 1 View  11/29/2015  CLINICAL DATA:  Rapid heart rate. Chest pressure. Known history of coronary artery disease, diabetes, hypertension, hyperlipidemia. EXAM: CHEST 1 VIEW COMPARISON:  None. FINDINGS: Mild cardiomegaly. Lungs are clear. No evidence of pneumonia. No pleural effusion or pneumothorax seen. Osseous structures about the chest are unremarkable. IMPRESSION: Mild cardiomegaly.  No active disease. Electronically Signed   By: Bary RichardStan  Maynard M.D.   On: 11/29/2015 18:07    EKG:   Orders placed or performed during the hospital encounter of 11/29/15  . EKG 12-Lead  . EKG 12-Lead  . EKG 12-Lead immediately post procedure  . EKG 12-Lead  . EKG 12-Lead immediately post procedure  . EKG 12-Lead      Management plans discussed with the patient, family and they are in agreement.  CODE STATUS:     Code Status Orders        Start     Ordered   11/29/15 1609  Full code   Continuous     11/29/15 1609    Code Status History    Date Active Date Inactive Code Status Order ID Comments User Context   07/30/2015 10:04 AM 07/31/2015  1:16 PM Full Code [id number]  Laurier NancyShaukat A Khan, MD Inpatient   07/30/2015 10:03 AM 07/30/2015 10:04 AM Full Code [id number]  Laurier NancyShaukat A Khan, MD Inpatient      TOTAL TIME TAKING CARE OF THIS PATIENT: 35 minutes.    Altamese DillingVACHHANI, Kanden Carey M.D on 12/01/2015 at 1:45 PM  Between 7am to 6pm - Pager - [phone]  After 6pm go to [email or web address] - password [email or web address]  [facility]  Office  [phone]  CC: Primary care physician; Carolynn ServeHINSON,JOHN, MD   Note: This dictation was prepared with Dragon dictation along with smaller phrase technology. Any transcriptional errors that result from this process are unintentional.

## 2016-06-30 DIAGNOSIS — N183 Chronic kidney disease, stage 3 unspecified: Secondary | ICD-10-CM | POA: Insufficient documentation

## 2016-07-07 DIAGNOSIS — I482 Chronic atrial fibrillation, unspecified: Secondary | ICD-10-CM | POA: Insufficient documentation

## 2017-08-30 ENCOUNTER — Other Ambulatory Visit: Payer: Self-pay

## 2017-08-30 ENCOUNTER — Inpatient Hospital Stay
Admit: 2017-08-30 | Discharge: 2017-08-30 | Disposition: A | Discharge status: Home / Self Care | Payer: BLUE CROSS/BLUE SHIELD | Attending: Family Medicine | Admitting: Family Medicine

## 2017-08-30 ENCOUNTER — Encounter: Payer: Self-pay | Admitting: Emergency Medicine

## 2017-08-30 ENCOUNTER — Emergency Department: Payer: BLUE CROSS/BLUE SHIELD

## 2017-08-30 ENCOUNTER — Inpatient Hospital Stay
Admission: EM | Admit: 2017-08-30 | Discharge: 2017-09-01 | DRG: 229 | Disposition: A | Discharge status: Home / Self Care | Payer: BLUE CROSS/BLUE SHIELD | Attending: Internal Medicine | Admitting: Internal Medicine

## 2017-08-30 DIAGNOSIS — I2510 Atherosclerotic heart disease of native coronary artery without angina pectoris: Secondary | ICD-10-CM | POA: Diagnosis present

## 2017-08-30 DIAGNOSIS — N183 Chronic kidney disease, stage 3 (moderate): Secondary | ICD-10-CM | POA: Diagnosis present

## 2017-08-30 DIAGNOSIS — D649 Anemia, unspecified: Secondary | ICD-10-CM | POA: Diagnosis present

## 2017-08-30 DIAGNOSIS — E7800 Pure hypercholesterolemia, unspecified: Secondary | ICD-10-CM | POA: Diagnosis present

## 2017-08-30 DIAGNOSIS — Z87891 Personal history of nicotine dependence: Secondary | ICD-10-CM | POA: Diagnosis not present

## 2017-08-30 DIAGNOSIS — Z820 Family history of epilepsy and other diseases of the nervous system: Secondary | ICD-10-CM | POA: Diagnosis not present

## 2017-08-30 DIAGNOSIS — K219 Gastro-esophageal reflux disease without esophagitis: Secondary | ICD-10-CM | POA: Diagnosis present

## 2017-08-30 DIAGNOSIS — N400 Enlarged prostate without lower urinary tract symptoms: Secondary | ICD-10-CM | POA: Diagnosis present

## 2017-08-30 DIAGNOSIS — Z955 Presence of coronary angioplasty implant and graft: Secondary | ICD-10-CM | POA: Diagnosis not present

## 2017-08-30 DIAGNOSIS — I452 Bifascicular block: Secondary | ICD-10-CM | POA: Diagnosis present

## 2017-08-30 DIAGNOSIS — R55 Syncope and collapse: Secondary | ICD-10-CM | POA: Diagnosis present

## 2017-08-30 DIAGNOSIS — E1100 Type 2 diabetes mellitus with hyperosmolarity without nonketotic hyperglycemic-hyperosmolar coma (NKHHC): Secondary | ICD-10-CM | POA: Diagnosis not present

## 2017-08-30 DIAGNOSIS — Z7984 Long term (current) use of oral hypoglycemic drugs: Secondary | ICD-10-CM

## 2017-08-30 DIAGNOSIS — E1165 Type 2 diabetes mellitus with hyperglycemia: Secondary | ICD-10-CM | POA: Diagnosis present

## 2017-08-30 DIAGNOSIS — I129 Hypertensive chronic kidney disease with stage 1 through stage 4 chronic kidney disease, or unspecified chronic kidney disease: Secondary | ICD-10-CM | POA: Diagnosis present

## 2017-08-30 DIAGNOSIS — K769 Liver disease, unspecified: Secondary | ICD-10-CM | POA: Diagnosis present

## 2017-08-30 DIAGNOSIS — I482 Chronic atrial fibrillation: Secondary | ICD-10-CM | POA: Diagnosis present

## 2017-08-30 DIAGNOSIS — I4892 Unspecified atrial flutter: Secondary | ICD-10-CM | POA: Diagnosis present

## 2017-08-30 DIAGNOSIS — E876 Hypokalemia: Secondary | ICD-10-CM | POA: Diagnosis present

## 2017-08-30 DIAGNOSIS — Z8249 Family history of ischemic heart disease and other diseases of the circulatory system: Secondary | ICD-10-CM

## 2017-08-30 DIAGNOSIS — I472 Ventricular tachycardia, unspecified: Secondary | ICD-10-CM

## 2017-08-30 DIAGNOSIS — I495 Sick sinus syndrome: Secondary | ICD-10-CM | POA: Diagnosis present

## 2017-08-30 DIAGNOSIS — E1122 Type 2 diabetes mellitus with diabetic chronic kidney disease: Secondary | ICD-10-CM | POA: Diagnosis present

## 2017-08-30 DIAGNOSIS — R001 Bradycardia, unspecified: Secondary | ICD-10-CM

## 2017-08-30 HISTORY — DX: Unspecified osteoarthritis, unspecified site: M19.90

## 2017-08-30 HISTORY — DX: Liver disease, unspecified: K76.9

## 2017-08-30 HISTORY — DX: Unspecified atrial fibrillation: I48.91

## 2017-08-30 HISTORY — DX: Gastrointestinal hemorrhage, unspecified: K92.2

## 2017-08-30 LAB — GLUCOSE, CAPILLARY
GLUCOSE-CAPILLARY: 391 mg/dL — AB (ref 65–99)
Glucose-Capillary: 265 mg/dL — ABNORMAL HIGH (ref 65–99)

## 2017-08-30 LAB — BASIC METABOLIC PANEL
Anion gap: 14 (ref 5–15)
BUN: 66 mg/dL — AB (ref 6–20)
CALCIUM: 10 mg/dL (ref 8.9–10.3)
CHLORIDE: 95 mmol/L — AB (ref 101–111)
CO2: 29 mmol/L (ref 22–32)
CREATININE: 2.2 mg/dL — AB (ref 0.61–1.24)
GFR, EST AFRICAN AMERICAN: 35 mL/min — AB (ref >60)
GFR, EST NON AFRICAN AMERICAN: 30 mL/min — AB (ref >60)
Glucose, Bld: 306 mg/dL — ABNORMAL HIGH (ref 65–99)
Potassium: 2.9 mmol/L — ABNORMAL LOW (ref 3.5–5.1)
SODIUM: 138 mmol/L (ref 135–145)

## 2017-08-30 LAB — TYPE AND SCREEN
ABO/RH(D): A POS
ANTIBODY SCREEN: NEGATIVE

## 2017-08-30 LAB — CBC WITH DIFFERENTIAL/PLATELET
BASOS PCT: 1 %
Basophils Absolute: 0.1 10*3/uL (ref 0–0.1)
EOS ABS: 0.1 10*3/uL (ref 0–0.7)
Eosinophils Relative: 2 %
HEMATOCRIT: 31.6 % — AB (ref 40.0–52.0)
HEMOGLOBIN: 9.9 g/dL — AB (ref 13.0–18.0)
Lymphocytes Relative: 11 %
Lymphs Abs: 0.9 10*3/uL — ABNORMAL LOW (ref 1.0–3.6)
MCH: 22.4 pg — ABNORMAL LOW (ref 26.0–34.0)
MCHC: 31.4 g/dL — AB (ref 32.0–36.0)
MCV: 71.3 fL — ABNORMAL LOW (ref 80.0–100.0)
Monocytes Absolute: 0.8 10*3/uL (ref 0.2–1.0)
Monocytes Relative: 9 %
NEUTROS ABS: 6.7 10*3/uL — AB (ref 1.4–6.5)
NEUTROS PCT: 77 %
Platelets: 203 10*3/uL (ref 150–440)
RBC: 4.43 MIL/uL (ref 4.40–5.90)
RDW: 22.3 % — ABNORMAL HIGH (ref 11.5–14.5)
WBC: 8.6 10*3/uL (ref 3.8–10.6)

## 2017-08-30 LAB — COMPREHENSIVE METABOLIC PANEL
ALBUMIN: 4.4 g/dL (ref 3.5–5.0)
ALK PHOS: 117 U/L (ref 38–126)
ALT: 16 U/L — ABNORMAL LOW (ref 17–63)
ANION GAP: 17 — AB (ref 5–15)
AST: 31 U/L (ref 15–41)
BILIRUBIN TOTAL: 0.9 mg/dL (ref 0.3–1.2)
BUN: 65 mg/dL — AB (ref 6–20)
CALCIUM: 10.1 mg/dL (ref 8.9–10.3)
CO2: 27 mmol/L (ref 22–32)
Chloride: 92 mmol/L — ABNORMAL LOW (ref 101–111)
Creatinine, Ser: 2.3 mg/dL — ABNORMAL HIGH (ref 0.61–1.24)
GFR calc Af Amer: 33 mL/min — ABNORMAL LOW (ref >60)
GFR calc non Af Amer: 28 mL/min — ABNORMAL LOW (ref >60)
GLUCOSE: 413 mg/dL — AB (ref 65–99)
POTASSIUM: 2.4 mmol/L — AB (ref 3.5–5.1)
SODIUM: 136 mmol/L (ref 135–145)
TOTAL PROTEIN: 8.5 g/dL — AB (ref 6.5–8.1)

## 2017-08-30 LAB — TSH: TSH: 0.657 u[IU]/mL (ref 0.350–4.500)

## 2017-08-30 LAB — MAGNESIUM: Magnesium: 2.8 mg/dL — ABNORMAL HIGH (ref 1.7–2.4)

## 2017-08-30 LAB — MRSA PCR SCREENING: MRSA BY PCR: NEGATIVE

## 2017-08-30 LAB — TROPONIN I
TROPONIN I: 0.06 ng/mL — AB (ref <0.03)
Troponin I: 0.05 ng/mL (ref <0.03)

## 2017-08-30 MED ORDER — BISACODYL 10 MG RE SUPP: 10.0000 mg | Freq: Every day | RECTAL | Status: DC | PRN

## 2017-08-30 MED ORDER — NITROGLYCERIN 0.4 MG SL SUBL: 0.4000 mg | SUBLINGUAL_TABLET | SUBLINGUAL | Status: DC | PRN

## 2017-08-30 MED ORDER — POTASSIUM CHLORIDE 20 MEQ PO PACK
60.0000 meq | PACK | Freq: Once | ORAL | Status: AC
  Administered 2017-08-30: 60 meq via ORAL
  Filled 2017-08-30: qty 3

## 2017-08-30 MED ORDER — FERROUS SULFATE 325 (65 FE) MG PO TABS
325.0000 mg | ORAL_TABLET | Freq: Every day | ORAL | Status: DC
  Administered 2017-08-31 – 2017-09-01 (×2): 325 mg via ORAL
  Filled 2017-08-30 (×2): qty 1

## 2017-08-30 MED ORDER — ASPIRIN EC 81 MG PO TBEC
81.0000 mg | DELAYED_RELEASE_TABLET | Freq: Every day | ORAL | Status: DC
  Administered 2017-08-30 – 2017-08-31 (×2): 81 mg via ORAL
  Filled 2017-08-30 (×2): qty 1

## 2017-08-30 MED ORDER — VITAMIN C 500 MG PO TABS
500.0000 mg | ORAL_TABLET | Freq: Two times a day (BID) | ORAL | Status: DC
  Administered 2017-08-30 – 2017-09-01 (×4): 500 mg via ORAL
  Filled 2017-08-30 (×4): qty 1

## 2017-08-30 MED ORDER — MAGNESIUM SULFATE 2 GM/50ML IV SOLN
2.0000 g | Freq: Once | INTRAVENOUS | Status: AC
  Administered 2017-08-30: 2 g via INTRAVENOUS
  Filled 2017-08-30: qty 50

## 2017-08-30 MED ORDER — POTASSIUM CHLORIDE IN NACL 20-0.45 MEQ/L-% IV SOLN
INTRAVENOUS | Status: DC
  Administered 2017-08-30: 19:00:00 via INTRAVENOUS
  Filled 2017-08-30 (×3): qty 1000

## 2017-08-30 MED ORDER — LIDOCAINE IN D5W 4-5 MG/ML-% IV SOLN
1.0000 mg/min | INTRAVENOUS | Status: DC
  Administered 2017-08-30: 1 mg/min via INTRAVENOUS
  Filled 2017-08-30: qty 500

## 2017-08-30 MED ORDER — AMIODARONE HCL 200 MG PO TABS
200.0000 mg | ORAL_TABLET | Freq: Two times a day (BID) | ORAL | Status: DC
  Administered 2017-08-31 – 2017-09-01 (×2): 200 mg via ORAL
  Filled 2017-08-30 (×2): qty 1

## 2017-08-30 MED ORDER — PANTOPRAZOLE SODIUM 40 MG IV SOLR: 40.0000 mg | INTRAVENOUS | Status: DC

## 2017-08-30 MED ORDER — POLYETHYLENE GLYCOL 3350 17 G PO PACK
17.0000 g | PACK | Freq: Every day | ORAL | Status: DC | PRN
  Administered 2017-08-30: 17 g via ORAL
  Filled 2017-08-30: qty 1

## 2017-08-30 MED ORDER — POTASSIUM CHLORIDE 10 MEQ/50ML IV SOLN
10.0000 meq | INTRAVENOUS | Status: DC
  Filled 2017-08-30 (×4): qty 50

## 2017-08-30 MED ORDER — ONDANSETRON HCL 4 MG PO TABS: 4.0000 mg | ORAL_TABLET | Freq: Four times a day (QID) | ORAL | Status: DC | PRN

## 2017-08-30 MED ORDER — ATORVASTATIN CALCIUM 20 MG PO TABS
40.0000 mg | ORAL_TABLET | Freq: Every day | ORAL | Status: DC
  Administered 2017-08-31 – 2017-09-01 (×2): 40 mg via ORAL
  Filled 2017-08-30 (×2): qty 2

## 2017-08-30 MED ORDER — HEPARIN SODIUM (PORCINE) 5000 UNIT/ML IJ SOLN
5000.0000 [IU] | Freq: Three times a day (TID) | INTRAMUSCULAR | Status: DC
  Administered 2017-08-30: 5000 [IU] via SUBCUTANEOUS
  Filled 2017-08-30: qty 1

## 2017-08-30 MED ORDER — POTASSIUM CHLORIDE 10 MEQ/100ML IV SOLN
10.0000 meq | Freq: Once | INTRAVENOUS | Status: AC
  Administered 2017-08-30: 10 meq via INTRAVENOUS
  Filled 2017-08-30: qty 100

## 2017-08-30 MED ORDER — SUCRALFATE 1 G PO TABS
1.0000 g | ORAL_TABLET | Freq: Every day | ORAL | Status: DC
  Administered 2017-08-31 – 2017-09-01 (×2): 1 g via ORAL
  Filled 2017-08-30 (×2): qty 1

## 2017-08-30 MED ORDER — ADULT MULTIVITAMIN W/MINERALS CH
1.0000 | ORAL_TABLET | Freq: Every day | ORAL | Status: DC
  Administered 2017-08-31 – 2017-09-01 (×2): 1 via ORAL
  Filled 2017-08-30 (×2): qty 1

## 2017-08-30 MED ORDER — ALLOPURINOL 100 MG PO TABS
100.0000 mg | ORAL_TABLET | Freq: Every day | ORAL | Status: DC
  Administered 2017-08-31 – 2017-09-01 (×2): 100 mg via ORAL
  Filled 2017-08-30 (×2): qty 1

## 2017-08-30 MED ORDER — DOCUSATE SODIUM 100 MG PO CAPS
100.0000 mg | ORAL_CAPSULE | Freq: Two times a day (BID) | ORAL | Status: DC
  Administered 2017-08-30 – 2017-09-01 (×4): 100 mg via ORAL
  Filled 2017-08-30 (×4): qty 1

## 2017-08-30 MED ORDER — HEPARIN SODIUM (PORCINE) 5000 UNIT/ML IJ SOLN: 5000.0000 [IU] | Freq: Three times a day (TID) | INTRAMUSCULAR | Status: DC

## 2017-08-30 MED ORDER — TORSEMIDE 20 MG PO TABS
20.0000 mg | ORAL_TABLET | Freq: Every day | ORAL | Status: DC
  Administered 2017-08-31 – 2017-09-01 (×2): 20 mg via ORAL
  Filled 2017-08-30 (×2): qty 1

## 2017-08-30 MED ORDER — LIDOCAINE BOLUS VIA INFUSION
100.0000 mg | Freq: Once | INTRAVENOUS | Status: AC
  Administered 2017-08-30: 100 mg via INTRAVENOUS
  Filled 2017-08-30: qty 100

## 2017-08-30 MED ORDER — FINASTERIDE 5 MG PO TABS
5.0000 mg | ORAL_TABLET | Freq: Every day | ORAL | Status: DC
  Administered 2017-08-31 – 2017-09-01 (×2): 5 mg via ORAL
  Filled 2017-08-30 (×2): qty 1

## 2017-08-30 MED ORDER — POTASSIUM CHLORIDE CRYS ER 20 MEQ PO TBCR
40.0000 meq | EXTENDED_RELEASE_TABLET | Freq: Once | ORAL | Status: AC
  Administered 2017-08-30: 40 meq via ORAL
  Filled 2017-08-30: qty 2

## 2017-08-30 MED ORDER — MORPHINE SULFATE (PF) 2 MG/ML IV SOLN
2.0000 mg | INTRAVENOUS | Status: DC | PRN
Start: 2017-08-30 — End: 2017-09-01

## 2017-08-30 MED ORDER — PANTOPRAZOLE SODIUM 40 MG PO TBEC
40.0000 mg | DELAYED_RELEASE_TABLET | Freq: Every day | ORAL | Status: DC
  Administered 2017-08-31 – 2017-09-01 (×2): 40 mg via ORAL
  Filled 2017-08-30: qty 2
  Filled 2017-08-30 (×2): qty 1

## 2017-08-30 MED ORDER — HYDRALAZINE HCL 20 MG/ML IJ SOLN: 10.0000 mg | INTRAMUSCULAR | Status: DC | PRN

## 2017-08-30 MED ORDER — CEFAZOLIN SODIUM-DEXTROSE 2-4 GM/100ML-% IV SOLN
2.0000 g | INTRAVENOUS | Status: DC
  Filled 2017-08-30: qty 100

## 2017-08-30 MED ORDER — INSULIN ASPART 100 UNIT/ML ~~LOC~~ SOLN
0.0000 [IU] | Freq: Every day | SUBCUTANEOUS | Status: DC
  Administered 2017-08-30: 3 [IU] via SUBCUTANEOUS
  Administered 2017-08-31: 2 [IU] via SUBCUTANEOUS
  Filled 2017-08-30 (×2): qty 1

## 2017-08-30 MED ORDER — ISOSORBIDE MONONITRATE ER 30 MG PO TB24
60.0000 mg | ORAL_TABLET | Freq: Every day | ORAL | Status: DC
  Administered 2017-09-01: 60 mg via ORAL
  Filled 2017-08-30: qty 2
  Filled 2017-08-30: qty 1

## 2017-08-30 MED ORDER — HYDROCODONE-ACETAMINOPHEN 5-325 MG PO TABS
1.0000 | ORAL_TABLET | ORAL | Status: DC | PRN
  Administered 2017-09-01: 1 via ORAL
  Filled 2017-08-30: qty 1

## 2017-08-30 MED ORDER — INSULIN ASPART 100 UNIT/ML ~~LOC~~ SOLN
0.0000 [IU] | Freq: Three times a day (TID) | SUBCUTANEOUS | Status: DC
  Administered 2017-08-30: 20 [IU] via SUBCUTANEOUS
  Administered 2017-08-31: 4 [IU] via SUBCUTANEOUS
  Administered 2017-08-31: 7 [IU] via SUBCUTANEOUS
  Administered 2017-09-01: 4 [IU] via SUBCUTANEOUS
  Filled 2017-08-30 (×4): qty 1

## 2017-08-30 MED ORDER — ONDANSETRON HCL 4 MG/2ML IJ SOLN
INTRAMUSCULAR | Status: AC
Start: 2017-08-30 — End: 2017-08-31
  Filled 2017-08-30: qty 2

## 2017-08-30 MED ORDER — SODIUM CHLORIDE 0.9 % IV BOLUS
500.0000 mL | Freq: Once | INTRAVENOUS | Status: AC
  Administered 2017-08-30: 500 mL via INTRAVENOUS

## 2017-08-30 MED ORDER — HYDRALAZINE HCL 50 MG PO TABS
25.0000 mg | ORAL_TABLET | Freq: Every day | ORAL | Status: DC
  Administered 2017-09-01: 25 mg via ORAL
  Filled 2017-08-30: qty 1

## 2017-08-30 MED ORDER — TAMSULOSIN HCL 0.4 MG PO CAPS
0.4000 mg | ORAL_CAPSULE | Freq: Every day | ORAL | Status: DC
  Administered 2017-08-31 – 2017-09-01 (×2): 0.4 mg via ORAL
  Filled 2017-08-30 (×2): qty 1

## 2017-08-30 MED ORDER — ACETAMINOPHEN 650 MG RE SUPP: 650.0000 mg | Freq: Four times a day (QID) | RECTAL | Status: DC | PRN

## 2017-08-30 MED ORDER — ONDANSETRON HCL 4 MG/2ML IJ SOLN: 4.0000 mg | Freq: Four times a day (QID) | INTRAMUSCULAR | Status: DC | PRN

## 2017-08-30 MED ORDER — ONDANSETRON HCL 4 MG/2ML IJ SOLN
4.0000 mg | Freq: Once | INTRAMUSCULAR | Status: AC
  Administered 2017-08-30: 4 mg via INTRAVENOUS

## 2017-08-30 MED ORDER — POTASSIUM CHLORIDE CRYS ER 20 MEQ PO TBCR
20.0000 meq | EXTENDED_RELEASE_TABLET | Freq: Two times a day (BID) | ORAL | Status: DC
  Administered 2017-08-31 – 2017-09-01 (×3): 20 meq via ORAL
  Filled 2017-08-30 (×3): qty 1

## 2017-08-30 MED ORDER — METOLAZONE 5 MG PO TABS
5.0000 mg | ORAL_TABLET | Freq: Every day | ORAL | Status: DC
  Filled 2017-08-30 (×2): qty 1

## 2017-08-30 MED ORDER — GLIPIZIDE 5 MG PO TABS: 5.0000 mg | ORAL_TABLET | Freq: Two times a day (BID) | ORAL | Status: DC

## 2017-08-30 MED ORDER — ACETAMINOPHEN 325 MG PO TABS
650.0000 mg | ORAL_TABLET | Freq: Four times a day (QID) | ORAL | Status: DC | PRN
  Administered 2017-08-31: 650 mg via ORAL
  Filled 2017-08-30: qty 2

## 2017-08-30 MED ORDER — SODIUM CHLORIDE 0.45 % IV SOLN
INTRAVENOUS | Status: DC
  Administered 2017-08-30 – 2017-09-01 (×4): via INTRAVENOUS
  Filled 2017-08-30 (×6): qty 1000

## 2017-08-30 NOTE — H&P (Signed)
[facility] - [place] at [facility]   PATIENT NAME: Wyatt [name]    MR#:  [id number]  DATE OF BIRTH:  July 02, 1950  DATE OF ADMISSION:  08/30/2017  PRIMARY CARE PHYSICIAN: Carolynn Serve, MD   REQUESTING/REFERRING PHYSICIAN:   CHIEF COMPLAINT:   Chief Complaint  Patient presents with  . Loss of Consciousness  . Irregular Heart Beat    HISTORY OF PRESENT ILLNESS: Wyatt [name]  is a 67 y.o. male with a known history of per below which also includes plans for pacemaker on Tuesday by Dr. Cassie Freer, syncopal episodes, chronic kidney disease stage III/IV, presenting from the patient's cardiologist office Dr. 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 Kahn/cardiology-recommended lidocaine drip, Dr. Cassie Freer currently evaluating patient, patient only complained of nausea on evaluation, noted heart rate in the 40s-60s range, denied chest pain, patient is now been admitted for acute tachybradycardia syndrome with associated syncope, severe hypokalemia, hyperglycemia with diabetes mellitus type 2.  PAST MEDICAL HISTORY:   Past Medical History:  Diagnosis Date  . Anginal pain (HCC)   . Arthritis   . Atrial fibrillation (HCC)   . Chronic kidney disease   . Diabetes mellitus without complication (HCC)   . Enlarged prostate   . GI bleed   . High cholesterol   . Hypertension   . Liver lesion   . Shortness of breath dyspnea     PAST SURGICAL HISTORY:  Past Surgical History:   Procedure Laterality Date  . CARDIAC CATHETERIZATION Left 07/30/2015   Procedure: Left Heart Cath and Coronary Angiography;  Surgeon: Laurier Nancy, MD;  Location: [facility] INVASIVE CV LAB;  Service: Cardiovascular;  Laterality: Left;  . CARDIAC CATHETERIZATION N/A 07/30/2015   Procedure: Coronary Stent Intervention;  Surgeon: Marcina Millard, MD;  Location: [facility] INVASIVE CV LAB;  Service: Cardiovascular;  Laterality: N/A;  . CARDIAC CATHETERIZATION Left 11/30/2015   Procedure: Left Heart Cath and Coronary Angiography;  Surgeon: Laurier Nancy, MD;  Location: [facility] INVASIVE CV LAB;  Service: Cardiovascular;  Laterality: Left;  . CARDIAC CATHETERIZATION N/A 11/30/2015   Procedure: Coronary Stent Intervention;  Surgeon: Alwyn Pea, MD;  Location: [facility] INVASIVE CV LAB;  Service: Cardiovascular;  Laterality: N/A;  . kidney stones    . KNEE ARTHROCENTESIS Right     SOCIAL HISTORY:  Social History   Tobacco Use  . Smoking status: Former Smoker    Packs/day: 0.00    Years: 0.00    [place] years: 0.00    Last attempt to quit: 07/29/1969    Years since quitting: 48.1  . Smokeless tobacco: Former Engineer, water Use Topics  . Alcohol use: Yes    Alcohol/week: 0.6 oz    Types: 1 Cans of beer per week    FAMILY HISTORY:  Family History  Problem Relation Age of Onset  . CAD Mother   . Dementia Mother   . Parkinson's disease Mother   . CAD Father     DRUG ALLERGIES:  Allergies  Allergen Reactions  . Niacin And Related Other (See Comments)  Flushing  . Paroxetine Hcl Itching    REVIEW OF SYSTEMS:   CONSTITUTIONAL: No fever, +fatigue, weakness.  EYES: No blurred or double vision.  EARS, NOSE, AND THROAT: No tinnitus or ear pain.  RESPIRATORY: No cough, shortness of breath, wheezing or hemoptysis.  CARDIOVASCULAR: No chest pain, orthopnea, edema.  GASTROINTESTINAL: + nausea, vomiting, no diarrhea or abdominal pain.  GENITOURINARY: No dysuria, hematuria.  ENDOCRINE: No  polyuria, nocturia,  HEMATOLOGY: No anemia, easy bruising or bleeding SKIN: No rash or lesion. MUSCULOSKELETAL: No joint pain or arthritis.   NEUROLOGIC: No tingling, numbness, weakness.  PSYCHIATRY: No anxiety or depression.   MEDICATIONS AT HOME:  Prior to Admission medications   Medication Sig Start Date End Date Taking? Authorizing Provider  allopurinol (ZYLOPRIM) 100 MG tablet Take 100 mg by mouth daily.   Yes [provider]  amiodarone (PACERONE) 200 MG tablet Take 200 mg by mouth 2 (two) times daily.   Yes [provider]  atorvastatin (LIPITOR) 40 MG tablet Take 1 tablet (40 mg total) by mouth at bedtime. Patient taking differently: Take 40 mg by mouth daily.  12/01/15  Yes Altamese Dilling, MD  doxazosin (CARDURA) 4 MG tablet Take 4 mg by mouth daily.    Yes [provider]  ferrous sulfate 325 (65 FE) MG tablet Take 325 mg by mouth daily.   Yes [provider]  finasteride (PROSCAR) 5 MG tablet Take 5 mg by mouth daily.   Yes [provider]  glipiZIDE (GLUCOTROL) 5 MG tablet Take 5 mg by mouth 2 (two) times daily before a meal.   Yes [provider]  hydrALAZINE (APRESOLINE) 25 MG tablet Take 25 mg by mouth daily.   Yes [provider]  isosorbide mononitrate (IMDUR) 60 MG 24 hr tablet Take 60 mg by mouth daily.   Yes [provider]  metolazone (ZAROXOLYN) 5 MG tablet Take 5 mg by mouth daily.   Yes [provider]  Multiple Vitamin (MULTIVITAMIN WITH MINERALS) TABS tablet Take 1 tablet by mouth daily.   Yes [provider]  nitroGLYCERIN (NITROSTAT) 0.4 MG SL tablet Place 0.4 mg under the tongue every 5 (five) minutes as needed for chest pain.   Yes [provider]  pantoprazole (PROTONIX) 20 MG tablet Take 20-40 mg by mouth daily.   Yes [provider]  potassium chloride (K-DUR,KLOR-CON) 10 MEQ tablet Take 20 mEq by mouth 2 (two) times daily.  08/30/17  Yes [provider]  sucralfate (CARAFATE) 1 g tablet Take 1 g by mouth daily.   Yes [provider]  tamsulosin (FLOMAX) 0.4 MG CAPS capsule Take 0.4 mg by mouth daily.   Yes [provider]  torsemide (DEMADEX) 20 MG tablet Take 20 mg by mouth daily.   Yes [provider]  vitamin C (ASCORBIC ACID) 500 MG tablet Take 500 mg by mouth 2 (two) times daily.   Yes [provider]      PHYSICAL EXAMINATION:   VITAL SIGNS: Blood pressure (!) 162/68, pulse (!) 57, temperature (!) 97.5 F (36.4 C), temperature source Oral, resp. rate (!) 21, height 6\' 2"  (1.88 m), weight 110.2 kg (243 lb), SpO2 97 %.  GENERAL:  67 y.o.-year-old patient lying in the bed with mild acute distress.  Obese, ill-appearing EYES: Pupils equal, round, reactive to light and accommodation. No scleral icterus. Extraocular muscles intact.  HEENT: Head atraumatic, normocephalic. Oropharynx and nasopharynx clear.  NECK:  Supple, no jugular venous distention. No thyroid enlargement,  no tenderness.  LUNGS: Normal breath sounds bilaterally, no wheezing, rales,rhonchi or crepitation. No use of accessory muscles of respiration.  CARDIOVASCULAR: Irregular rate and rhythm, S1, S2 normal. No murmurs, rubs, or gallops.  ABDOMEN: Soft, nontender, nondistended. Bowel sounds present. No organomegaly or mass.  EXTREMITIES: No pedal edema, cyanosis, or clubbing.  NEUROLOGIC: Cranial nerves II through XII are intact. MAES. Gait not checked.  PSYCHIATRIC: The patient is alert and oriented x 3.  SKIN: No obvious rash, lesion, or ulcer.   LABORATORY PANEL:   CBC Recent Labs  Lab 08/30/17 1529  WBC 8.6  HGB 9.9*  HCT 31.6*  PLT 203  MCV 71.3*  MCH 22.4*  MCHC 31.4*  RDW 22.3*  LYMPHSABS 0.9*  MONOABS 0.8  EOSABS 0.1  BASOSABS 0.1   ------------------------------------------------------------------------------------------------------------------  Chemistries  Recent Labs  Lab 08/30/17 1529  NA  136  K 2.4*  CL 92*  CO2 27  GLUCOSE 413*  BUN 65*  CREATININE 2.30*  CALCIUM 10.1  AST 31  ALT 16*  ALKPHOS 117  BILITOT 0.9   ------------------------------------------------------------------------------------------------------------------ estimated creatinine clearance is 42.9 mL/min (A) (by C-G formula based on SCr of 2.3 mg/dL (H)). ------------------------------------------------------------------------------------------------------------------ No results for input(s): TSH, T4TOTAL, T3FREE, THYROIDAB in the last 72 hours.  Invalid input(s): FREET3   Coagulation profile No results for input(s): INR, PROTIME in the last 168 hours. ------------------------------------------------------------------------------------------------------------------- No results for input(s): DDIMER in the last 72 hours. -------------------------------------------------------------------------------------------------------------------  Cardiac Enzymes Recent Labs  Lab 08/30/17 1529  TROPONINI 0.05*   ------------------------------------------------------------------------------------------------------------------ Invalid input(s): POCBNP  ---------------------------------------------------------------------------------------------------------------  Urinalysis No results found for: COLORURINE, APPEARANCEUR, LABSPEC, PHURINE, GLUCOSEU, HGBUR, BILIRUBINUR, KETONESUR, PROTEINUR, UROBILINOGEN, NITRITE, LEUKOCYTESUR   RADIOLOGY: Ct Head Wo Contrast  Result Date: 08/30/2017 CLINICAL DATA:  Syncope. EXAM: CT HEAD WITHOUT CONTRAST TECHNIQUE: Contiguous axial images were obtained from the base of the skull through the vertex without intravenous contrast. COMPARISON:  CT head dated June 24, 2013. FINDINGS: Brain: No evidence of acute infarction, hemorrhage, hydrocephalus, extra-axial collection or mass lesion/mass effect. Mild age related cerebral and cerebellar atrophy, unchanged. Vascular: No  hyperdense vessel or unexpected calcification. Skull: Normal. Negative for fracture or focal lesion. Sinuses/Orbits: No acute finding. Other: None. IMPRESSION: 1.  No acute intracranial abnormality. Electronically Signed   By: Obie DredgeWilliam T Derry M.D.   On: 08/30/2017 15:57    EKG: Orders placed or performed during the hospital encounter of 08/30/17  . EKG 12-Lead  . EKG 12-Lead  . EKG 12-Lead  . EKG 12-Lead    IMPRESSION AND PLAN: 1 acute tachybradycardia syndrome Noted history of A. fib-not on oral anticoagulants-suspect due to history of GI bleeding Case discussed with cardiology/Dr. Cassie FreerParachos as well as intensivist, admit to stepdown unit, continue lidocaine drip, replete potassium, give IV magnesium, repeat BMP/magnesium in 4 hours as well as in the morning, check echocardiogram, continue telemetry, rule out acute coronary syndrome with cardiac enzymes x3 sets, aspirin, continue p.o. amiodarone, IV fluids for rehydration, supplemental oxygen as needed, imdur, IV morphine as needed, statin therapy-check lipids in the morning, continue close medical monitoring  2 acute syncope Most likely secondary to above Check carotid Dopplers, echocardiogram, fall precautions, rule out acute coronary syndrome as stated above  3 acute severe hypokalemia Check magnesium level, replete with p.o./IV potassium, repeat BMP/magnesium in 4 hours as well as in the morning  4 acute hyperglycemia with chronic diabetes mellitus type 2 Currently uncontrolled Continue glipizide, start sliding scale insulin with Accu-Cheks per routine, check hemoglobin A1c to determine level of  control, aspirin, statin therapy-check lipids in the morning  5 chronic kidney disease stage, III-IV BL Cr 1.6-2 Avoid nephrotoxic agents, strict I&O monitoring, daily weights, renally dose medications  6 history of coronary artery disease status post stenting Appears stable Continue statin therapy-check lipids in the morning, aspirin,  imdur, IV morphine as needed breakthrough pain, supplemental oxygen as needed  7 chronic GERD without esophagitis Stable PPI daily  8 chronic benign essential hypertension Currently uncontrolled Continue home regimen, IV hydralazine as needed systolic blood pressure greater than 160, vitals per routine, make changes as per necessary  9 anemia with history of GI bleeding Check anemia workup  Full code Condition guarded Prognosis fair DVT prophylaxis with heparin subcu Disposition pending clinical course  All the records are reviewed and case discussed with ED provider. Management plans discussed with the patient, family and they are in agreement.  CODE STATUS:full Code Status History    Date Active Date Inactive Code Status Order ID Comments User Context   11/29/2015 1609 12/01/2015 1745 Full Code [id number]  Alford Highland, MD Inpatient   07/30/2015 1004 07/31/2015 1316 Full Code [id number]  Laurier Nancy, MD Inpatient   07/30/2015 1003 07/30/2015 1004 Full Code [id number]  Laurier Nancy, MD Inpatient      TOTAL TIME TAKING CARE OF THIS PATIENT: 45 minutes.    Evelena Asa Collier Monica M.D on 08/30/2017   Between 7am to 6pm - Pager - [phone]  After 6pm go to [email or web address] - password [email or web address]  [facility] [place] [facility]  Office  [phone]  CC: Primary care physician; Carolynn Serve, MD   Note: This dictation was prepared with Dragon dictation along with smaller phrase technology. Any transcriptional errors that result from this process are unintentional.

## 2017-08-30 NOTE — ED Notes (Signed)
This RN accompanied pt to CT d/t critical status.

## 2017-08-30 NOTE — ED Notes (Signed)
Pt had "spell" as he calls it where he begins to feel nauseous then going into v-tach. This spell is transient and after vomiting, pt returns to a-fib. This happened x1 upon arrival to our ED.

## 2017-08-30 NOTE — Consult Note (Signed)
[facility] [place] [facility] Medicine Consultation      Name: Wyatt [name] MRN: [id number] DOB: 28-Jun-1950    ADMISSION DATE:  08/30/2017 CONSULTATION DATE:  3/28/ 2019  REFERRING MD :  Bertrum Sol, MD   CHIEF COMPLAINT:     Syncope   HISTORY OF PRESENT ILLNESS     Mr Wyatt [name] is a 67 year old gentleman referred to cardiology for evaluation of bradycardia and possible pacemaker.  The patient has known coronary artery disease, status post coronary stents, chronic atrial fibrillation/atrial flutter, with recent history of symptomatic bradycardia, scheduled for pacemaker next week.  The patient presents with recurrent syncopal episodes, witnessed today at Dr. Milta [name] office, brought to [facility] emergency room where he apparently had a 15 beat run of wide-complex tachycardia.  Patient placed on lidocaine drip because of prior history of bradycardia on amiodarone.  Of note, admission labs reveal potassium of 2.4.  Troponin was 0.05.   I have seen and examined the patient who reported that he has had some presyncopal symptoms while sitting in his chair.  This was the first event where he actually loss consciousness.  He did have a prodromal of feeling faint prior the the event.  He arrive in the ICU on Lidocaine drip.     SIGNIFICANT EVENTS    As per HPI    PAST MEDICAL HISTORY    :  Past Medical History:  Diagnosis Date  . Anginal pain (HCC)   . Arthritis   . Atrial fibrillation (HCC)   . Chronic kidney disease   . Diabetes mellitus without complication (HCC)   . Enlarged prostate   . GI bleed   . High cholesterol   . Hypertension   . Liver lesion   . Shortness of breath dyspnea    Past Surgical History:  Procedure Laterality Date  . CARDIAC CATHETERIZATION Left 07/30/2015   Procedure: Left Heart Cath and Coronary Angiography;  Surgeon: Laurier Nancy, MD;  Location: [facility] INVASIVE CV LAB;  Service: Cardiovascular;  Laterality: Left;  . CARDIAC CATHETERIZATION N/A 07/30/2015     Procedure: Coronary Stent Intervention;  Surgeon: Marcina Millard, MD;  Location: [facility] INVASIVE CV LAB;  Service: Cardiovascular;  Laterality: N/A;  . CARDIAC CATHETERIZATION Left 11/30/2015   Procedure: Left Heart Cath and Coronary Angiography;  Surgeon: Laurier Nancy, MD;  Location: [facility] INVASIVE CV LAB;  Service: Cardiovascular;  Laterality: Left;  . CARDIAC CATHETERIZATION N/A 11/30/2015   Procedure: Coronary Stent Intervention;  Surgeon: Alwyn Pea, MD;  Location: [facility] INVASIVE CV LAB;  Service: Cardiovascular;  Laterality: N/A;  . kidney stones    . KNEE ARTHROCENTESIS Right    Prior to Admission medications   Medication Sig Start Date End Date Taking? Authorizing Provider  allopurinol (ZYLOPRIM) 100 MG tablet Take 100 mg by mouth daily.   Yes [provider]  amiodarone (PACERONE) 200 MG tablet Take 200 mg by mouth 2 (two) times daily.   Yes [provider]  atorvastatin (LIPITOR) 40 MG tablet Take 1 tablet (40 mg total) by mouth at bedtime. Patient taking differently: Take 40 mg by mouth daily.  12/01/15  Yes Altamese Dilling, MD  doxazosin (CARDURA) 4 MG tablet Take 4 mg by mouth daily.    Yes [provider]  ferrous sulfate 325 (65 FE) MG tablet Take 325 mg by mouth daily.   Yes [provider]  finasteride (PROSCAR) 5 MG tablet Take 5 mg by mouth daily.   Yes [provider]  glipiZIDE (GLUCOTROL)  5 MG tablet Take 5 mg by mouth 2 (two) times daily before a meal.   Yes [provider]  hydrALAZINE (APRESOLINE) 25 MG tablet Take 25 mg by mouth daily.   Yes [provider]  isosorbide mononitrate (IMDUR) 60 MG 24 hr tablet Take 60 mg by mouth daily.   Yes [provider]  metolazone (ZAROXOLYN) 5 MG tablet Take 5 mg by mouth daily.   Yes [provider]  Multiple Vitamin (MULTIVITAMIN WITH MINERALS) TABS tablet Take 1 tablet by mouth daily.   Yes [provider]  nitroGLYCERIN  (NITROSTAT) 0.4 MG SL tablet Place 0.4 mg under the tongue every 5 (five) minutes as needed for chest pain.   Yes [provider]  pantoprazole (PROTONIX) 20 MG tablet Take 20-40 mg by mouth daily.   Yes [provider]  potassium chloride (K-DUR,KLOR-CON) 10 MEQ tablet Take 20 mEq by mouth 2 (two) times daily.  08/30/17  Yes [provider]  sucralfate (CARAFATE) 1 g tablet Take 1 g by mouth daily.   Yes [provider]  tamsulosin (FLOMAX) 0.4 MG CAPS capsule Take 0.4 mg by mouth daily.   Yes [provider]  torsemide (DEMADEX) 20 MG tablet Take 20 mg by mouth daily.   Yes [provider]  vitamin C (ASCORBIC ACID) 500 MG tablet Take 500 mg by mouth 2 (two) times daily.   Yes [provider]   Allergies  Allergen Reactions  . Niacin And Related Other (See Comments)    Flushing  . Paroxetine Hcl Itching     FAMILY HISTORY   Family History  Problem Relation Age of Onset  . CAD Mother   . Dementia Mother   . Parkinson's disease Mother   . CAD Father       SOCIAL HISTORY    reports that he quit smoking about 48 years ago. He smoked 0.00 packs per day for 0.00 years. He has quit using smokeless tobacco. He reports that he drinks about 0.6 oz of alcohol per week. He reports that he does not use drugs.  ROS CONSTITUTIONAL: No fever, +fatigue, weakness. [facility] [place] EYES: No blurred or double vision.  EARS, NOSE, AND THROAT: No tinnitus or ear pain.  RESPIRATORY: No cough, shortness of breath, wheezing or hemoptysis.  CARDIOVASCULAR: No chest pain, orthopnea, edema.  GASTROINTESTINAL: + nausea, vomiting, no diarrhea or abdominal pain.  GENITOURINARY: No dysuria, hematuria.  ENDOCRINE: No polyuria, nocturia,  HEMATOLOGY: No anemia, easy bruising or bleeding SKIN: No rash or lesion. MUSCULOSKELETAL: No joint pain or arthritis.   NEUROLOGIC: No tingling, numbness,  weakness.  PSYCHIATRY: No anxiety or depression.      VITAL SIGNS    Temp:  [97.5 F (36.4 C)-98 F (36.7 C)] 97.6 F (36.4 C) (03/28 1943) Pulse Rate:  [34-70] 53 (03/28 1943) Resp:  [14-28] 25 (03/28 1900) BP: (132-162)/(58-70) 146/63 (03/28 1900) SpO2:  [96 %-99 %] 99 % (03/28 1943) Weight:  [243 lb (110.2 kg)-244 lb 4.3 oz (110.8 kg)] 244 lb 4.3 oz (110.8 kg) (03/28 1810)   HEMODYNAMICS: No further compromise   OXYGEN: Room air   INTAKE / OUTPUT:  Intake/Output Summary (Last 24 hours) at 08/30/2017 1944 Last data filed at 08/30/2017 1911 Gross per 24 hour  Intake 740.58 ml  Output 800 ml  Net -59.42 ml       PHYSICAL EXAM    GENERAL:  Comfortable, no acute distress EYES: Pupils equal,  round, reactive to light and accommodation. No scleral icterus. Extraocular muscles intact.  HEENT: Head atraumatic, normocephalic. Oropharynx and nasopharynx clear.  NECK:  Supple, no jugular venous distention. No thyroid enlargement, no tenderness.  LUNGS: Normal breath sounds bilaterally, no wheezing, rales,rhonchi or crepitation. No use of accessory muscles of respiration.  CARDIOVASCULAR: Irregular rate and rhythm, S1, S2 normal. No murmurs, rubs, or gallops.  ABDOMEN: Soft, nontender, nondistended. Bowel sounds present. No organomegaly or mass.  EXTREMITIES: No pedal edema, cyanosis, or clubbing.  NEUROLOGIC: Cranial nerves II through XII are intact. MAES. Gait not checked.  PSYCHIATRIC: The patient is alert and oriented x 3.  SKIN: No obvious rash, lesion, or ulcer.       LABS   LABS:  CBC Recent Labs  Lab 08/30/17 1529  WBC 8.6  HGB 9.9*  HCT 31.6*  PLT 203   Coag's No results for input(s): APTT, INR in the last 168 hours. BMET Recent Labs  Lab 08/30/17 1529  NA 136  K 2.4*  CL 92*  CO2 27  BUN 65*  CREATININE 2.30*  GLUCOSE 413*   Electrolytes Recent Labs  Lab 08/30/17 1529 08/30/17 1715  CALCIUM 10.1  --   MG  --  2.8*   Sepsis  Markers No results for input(s): LATICACIDVEN, PROCALCITON, O2SATVEN in the last 168 hours. ABG No results for input(s): PHART, PCO2ART, PO2ART in the last 168 hours. Liver Enzymes Recent Labs  Lab 08/30/17 1529  AST 31  ALT 16*  ALKPHOS 117  BILITOT 0.9  ALBUMIN 4.4   Cardiac Enzymes Recent Labs  Lab 08/30/17 1529 08/30/17 1715  TROPONINI 0.05* 0.06*   Glucose Recent Labs  Lab 08/30/17 1817  GLUCAP 391*     No results found for this or any previous visit (from the past 240 hour(s)).   Current Facility-Administered Medications:  .  0.45 % NaCl with KCl 20 mEq / L infusion, , Intravenous, Continuous, Salary, Montell D, MD, Last Rate: 100 mL/hr at 08/30/17 1911 .  acetaminophen (TYLENOL) tablet 650 mg, 650 mg, Oral, Q6H PRN **OR** acetaminophen (TYLENOL) suppository 650 mg, 650 mg, Rectal, Q6H PRN, Salary, Montell D, MD .  Melene Muller ON 08/31/2017] allopurinol (ZYLOPRIM) tablet 100 mg, 100 mg, Oral, Daily, Salary, Montell D, MD .  amiodarone (PACERONE) tablet 200 mg, 200 mg, Oral, BID, Salary, Montell D, MD .  aspirin EC tablet 81 mg, 81 mg, Oral, Daily, Salary, Montell D, MD, 81 mg at 08/30/17 1840 .  [START ON 08/31/2017] atorvastatin (LIPITOR) tablet 40 mg, 40 mg, Oral, Daily, Salary, Montell D, MD .  bisacodyl (DULCOLAX) suppository 10 mg, 10 mg, Rectal, Daily PRN, Salary, Montell D, MD .  docusate sodium (COLACE) capsule 100 mg, 100 mg, Oral, BID, Salary, Montell D, MD .  Melene Muller ON 08/31/2017] ferrous sulfate tablet 325 mg, 325 mg, Oral, Daily, Salary, Montell D, MD .  Melene Muller ON 08/31/2017] finasteride (PROSCAR) tablet 5 mg, 5 mg, Oral, Daily, Salary, Montell D, MD .  heparin injection 5,000 Units, 5,000 Units, Subcutaneous, Q8H, Salary, Montell D, MD, Stopped at 08/30/17 1834 .  hydrALAZINE (APRESOLINE) injection 10 mg, 10 mg, Intravenous, Q4H PRN, Salary, Montell D, MD .  Melene Muller ON 08/31/2017] hydrALAZINE (APRESOLINE) tablet 25 mg, 25 mg, Oral, Daily, Salary, Montell D, MD .   HYDROcodone-acetaminophen (NORCO/VICODIN) 5-325 MG per tablet 1-2 tablet, 1-2 tablet, Oral, Q4H PRN, Salary, Montell D, MD .  insulin aspart (novoLOG) injection 0-20 Units, 0-20 Units, Subcutaneous, TID WC, Salary, Montell D, MD, 20 Units  at 08/30/17 1840 .  insulin aspart (novoLOG) injection 0-5 Units, 0-5 Units, Subcutaneous, QHS, Salary, Montell D, MD .  Melene Muller ON 08/31/2017] isosorbide mononitrate (IMDUR) 24 hr tablet 60 mg, 60 mg, Oral, Daily, Salary, Montell D, MD .  [COMPLETED] lidocaine (XYLOCAINE) 4 mg/mL bolus via infusion 100 mg, 100 mg, Intravenous, Once, 100 mg at 08/30/17 1632 **AND** lidocaine (cardiac) 2000 mg in dextrose 5% 500 mL (4mg /mL) IV infusion, 1 mg/min, Intravenous, Continuous, Willy Eddy, MD, Last Rate: 15 mL/hr at 08/30/17 1911, 1 mg/min at 08/30/17 1911 .  [START ON 08/31/2017] metolazone (ZAROXOLYN) tablet 5 mg, 5 mg, Oral, Daily, Salary, Montell D, MD .  morphine 2 MG/ML injection 2 mg, 2 mg, Intravenous, Q2H PRN, Salary, Montell D, MD .  Melene Muller ON 08/31/2017] multivitamin with minerals tablet 1 tablet, 1 tablet, Oral, Daily, Salary, Montell D, MD .  nitroGLYCERIN (NITROSTAT) SL tablet 0.4 mg, 0.4 mg, Sublingual, Q5 min PRN, Salary, Montell D, MD .  ondansetron (ZOFRAN) tablet 4 mg, 4 mg, Oral, Q6H PRN **OR** ondansetron (ZOFRAN) injection 4 mg, 4 mg, Intravenous, Q6H PRN, Salary, Montell D, MD .  Melene Muller ON 08/31/2017] pantoprazole (PROTONIX) EC tablet 40 mg, 40 mg, Oral, Daily, Salary, Montell D, MD .  polyethylene glycol (MIRALAX / GLYCOLAX) packet 17 g, 17 g, Oral, Daily PRN, Salary, Montell D, MD .  potassium chloride SA (K-DUR,KLOR-CON) CR tablet 20 mEq, 20 mEq, Oral, BID, Salary, Montell D, MD .  Melene Muller ON 08/31/2017] sucralfate (CARAFATE) tablet 1 g, 1 g, Oral, Daily, Salary, Montell D, MD .  Melene Muller ON 08/31/2017] tamsulosin (FLOMAX) capsule 0.4 mg, 0.4 mg, Oral, Daily, Salary, Montell D, MD .  Melene Muller ON 08/31/2017] torsemide (DEMADEX) tablet 20 mg, 20 mg, Oral,  Daily, Salary, Montell D, MD .  vitamin C (ASCORBIC ACID) tablet 500 mg, 500 mg, Oral, BID, Salary, Evelena Asa, MD  IMAGING    Ct Head Wo Contrast  Result Date: 08/30/2017 CLINICAL DATA:  Syncope. EXAM: CT HEAD WITHOUT CONTRAST TECHNIQUE: Contiguous axial images were obtained from the base of the skull through the vertex without intravenous contrast. COMPARISON:  CT head dated June 24, 2013. FINDINGS: Brain: No evidence of acute infarction, hemorrhage, hydrocephalus, extra-axial collection or mass lesion/mass effect. Mild age related cerebral and cerebellar atrophy, unchanged. Vascular: No hyperdense vessel or unexpected calcification. Skull: Normal. Negative for fracture or focal lesion. Sinuses/Orbits: No acute finding. Other: None. IMPRESSION: 1.  No acute intracranial abnormality. Electronically Signed   By: Obie Dredge M.D.   On: 08/30/2017 15:57     MAJOR EVENTS/TEST RESULTS: 3/28 admit for syncope  INDWELLING DEVICES:: Nil  MICRO DATA: MRSA PCR negative Urine  Blood Resp   ANTIMICROBIALS:  nil   ASSESSMENT/PLAN    1. Symptomatic non sustained ventricular tachycardia causing syncope 2. Atrial fibrillation/ flutter 3. Hypokalemia 4. Hx Type 2 DM with hyperglycemia 5. Hx of Chronic kidney disease 6. Hx of Anemia and GI bleed 7, Live lesion needs work up  Plans 1. Continue on Lidocaine drip as per Cardiology team. Plan  for PPM/? AICM in AM. 2. Electrolytes replacement 3. Target glucose management 4. Judicious fluid replacement 5. DVT prophylaxis    I have personally obtained a history, examined the patient, evaluated laboratory and independently reviewed  imaging results, formulated the assessment and plan and placed orders.   Overall, patient is critically ill, prognosis is guarded. Patient at high risk for cardiac arrest and death.   Jackson Latino, M.D

## 2017-08-30 NOTE — ED Provider Notes (Signed)
[facility] Emergency Department Provider Note    First MD Initiated Contact with Patient 08/30/17 1527     (approximate)  I have reviewed the triage vital signs and the nursing notes.   HISTORY  Chief Complaint Loss of Consciousness and Irregular Heart Beat    HPI Wyatt [name] is a 67 y.o. male presents with chief complaint of syncope lightheadedness.  Patient being evaluated for need for permanent pacemaker.  Does have evidence of A. fib he is on amiodarone.  Known CAD.  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  In the ER the patient started feeling nauseated additional syncopal episode with a 10 beat run of V. Tach..  Patient is complaining of epigastric discomfort and nausea.  Denies any chest pain or shortness of breath.  States he took his medications this morning  Past Medical History:  Diagnosis Date  . Anginal pain (HCC)   . Arthritis   . Atrial fibrillation (HCC)   . Chronic kidney disease   . Diabetes mellitus without complication (HCC)   . Enlarged prostate   . GI bleed   . High cholesterol   . Hypertension   . Liver lesion   . Shortness of breath dyspnea    Family History  Problem Relation Age of Onset  . CAD Mother   . Dementia Mother   . Parkinson's disease Mother   . CAD Father    Past Surgical History:  Procedure Laterality Date  . CARDIAC CATHETERIZATION Left 07/30/2015   Procedure: Left Heart Cath and Coronary Angiography;  Surgeon: Laurier NancyShaukat A Khan, MD;  Location: [facility] INVASIVE CV LAB;  Service: Cardiovascular;  Laterality: Left;  . CARDIAC CATHETERIZATION N/A 07/30/2015   Procedure: Coronary Stent Intervention;  Surgeon: Marcina MillardAlexander Paraschos, MD;  Location: [facility] INVASIVE CV LAB;  Service: Cardiovascular;  Laterality: N/A;  . CARDIAC CATHETERIZATION Left 11/30/2015   Procedure: Left Heart Cath and Coronary Angiography;  Surgeon: Laurier NancyShaukat  A Khan, MD;  Location: [facility] INVASIVE CV LAB;  Service: Cardiovascular;  Laterality: Left;  . CARDIAC CATHETERIZATION N/A 11/30/2015   Procedure: Coronary Stent Intervention;  Surgeon: Alwyn Peawayne D Callwood, MD;  Location: [facility] INVASIVE CV LAB;  Service: Cardiovascular;  Laterality: N/A;  . kidney stones    . KNEE ARTHROCENTESIS Right    Patient Active Problem List   Diagnosis Date Noted  . Tachy-brady syndrome (HCC) 08/30/2017  . Unstable angina (HCC) 11/29/2015  . Acute coronary artery obstruction without MI (HCC) 07/30/2015  . Coronary angioplasty status 07/30/2015      Prior to Admission medications   Medication Sig Start Date End Date Taking? Authorizing Provider  allopurinol (ZYLOPRIM) 100 MG tablet Take 100 mg by mouth daily.   Yes [provider]  amiodarone (PACERONE) 200 MG tablet Take 200 mg by mouth 2 (two) times daily.   Yes [provider]  atorvastatin (LIPITOR) 40 MG tablet Take 1 tablet (40 mg total) by mouth at bedtime. Patient taking differently: Take 40 mg by mouth daily.  12/01/15  Yes Altamese DillingVachhani, Vaibhavkumar, MD  doxazosin (CARDURA) 4 MG tablet Take 4 mg by mouth daily.    Yes [provider]  ferrous sulfate 325 (65 FE) MG tablet Take 325 mg by mouth daily.   Yes [provider]  finasteride (PROSCAR) 5 MG tablet Take 5 mg by mouth daily.   Yes [provider]  glipiZIDE (GLUCOTROL) 5 MG  tablet Take 5 mg by mouth 2 (two) times daily before a meal.   Yes [provider]  hydrALAZINE (APRESOLINE) 25 MG tablet Take 25 mg by mouth daily.   Yes [provider]  isosorbide mononitrate (IMDUR) 60 MG 24 hr tablet Take 60 mg by mouth daily.   Yes [provider]  metolazone (ZAROXOLYN) 5 MG tablet Take 5 mg by mouth daily.   Yes [provider]  Multiple Vitamin (MULTIVITAMIN WITH MINERALS) TABS tablet Take 1 tablet by mouth daily.   Yes [provider]  nitroGLYCERIN (NITROSTAT) 0.4 MG SL  tablet Place 0.4 mg under the tongue every 5 (five) minutes as needed for chest pain.   Yes [provider]  pantoprazole (PROTONIX) 20 MG tablet Take 20-40 mg by mouth daily.   Yes [provider]  potassium chloride (K-DUR,KLOR-CON) 10 MEQ tablet Take 20 mEq by mouth 2 (two) times daily.  08/30/17  Yes [provider]  sucralfate (CARAFATE) 1 g tablet Take 1 g by mouth daily.   Yes [provider]  tamsulosin (FLOMAX) 0.4 MG CAPS capsule Take 0.4 mg by mouth daily.   Yes [provider]  torsemide (DEMADEX) 20 MG tablet Take 20 mg by mouth daily.   Yes [provider]  vitamin C (ASCORBIC ACID) 500 MG tablet Take 500 mg by mouth 2 (two) times daily.   Yes [provider]    Allergies Niacin and related and Paroxetine hcl    Social History Social History   Tobacco Use  . Smoking status: Former Smoker    Packs/day: 0.00    Years: 0.00    [place] years: 0.00    Last attempt to quit: 07/29/1969    Years since quitting: 48.1  . Smokeless tobacco: Former Engineer, water Use Topics  . Alcohol use: Yes    Alcohol/week: 0.6 oz    Types: 1 Cans of beer per week  . Drug use: No    Review of Systems Patient denies headaches, rhinorrhea, blurry vision, numbness, shortness of breath, chest pain, edema, cough, abdominal pain, nausea, vomiting, diarrhea, dysuria, fevers, rashes or hallucinations unless otherwise stated above in HPI. ____________________________________________   PHYSICAL EXAM:  VITAL SIGNS: Vitals:   08/30/17 1530 08/30/17 1537  BP: (!) 162/68 (!) 162/68  Pulse: (!) 47 (!) 57  Resp: (!) 28 (!) 21  Temp:  (!) 97.5 F (36.4 C)  SpO2: 97% 97%    Constitutional: Alert and oriented. Ill and frail appearing, pale Eyes: Conjunctivae are normal.  Head: Atraumatic. Nose: No congestion/rhinnorhea. Mouth/Throat: Mucous membranes are moist.   Neck: No stridor. Painless ROM.  Cardiovascular: irregularly irregular  rhythm. Grossly normal heart sounds.  Good peripheral circulation. Respiratory: Normal respiratory effort.  No retractions. Lungs CTAB. Gastrointestinal: Soft and nontender. No distention. No abdominal bruits. No CVA tenderness. Genitourinary:  Musculoskeletal: No lower extremity tenderness nor edema.  No joint effusions. Neurologic:  Normal speech and language. No gross focal neurologic deficits are appreciated. No facial droop Skin:  Skin is warm, dry and intact. No rash noted.  ____________________________________________   LABS (all labs ordered are listed, but only abnormal results are displayed)  Results for orders placed or performed during the hospital encounter of 08/30/17 (from the past 24 hour(s))  CBC with Differential/Platelet     Status: Abnormal   Collection Time: 08/30/17  3:29 PM  Result Value Ref Range   WBC 8.6 3.8 - 10.6 K/uL   RBC 4.43 4.40 - 5.90  MIL/uL   Hemoglobin 9.9 (L) 13.0 - 18.0 g/dL   HCT 40.9 (L) 81.1 - 91.4 %   MCV 71.3 (L) 80.0 - 100.0 fL   MCH 22.4 (L) 26.0 - 34.0 pg   MCHC 31.4 (L) 32.0 - 36.0 g/dL   RDW 78.2 (H) 95.6 - 21.3 %   Platelets 203 150 - 440 K/uL   Neutrophils Relative % 77 %   Neutro Abs 6.7 (H) 1.4 - 6.5 K/uL   Lymphocytes Relative 11 %   Lymphs Abs 0.9 (L) 1.0 - 3.6 K/uL   Monocytes Relative 9 %   Monocytes Absolute 0.8 0.2 - 1.0 K/uL   Eosinophils Relative 2 %   Eosinophils Absolute 0.1 0 - 0.7 K/uL   Basophils Relative 1 %   Basophils Absolute 0.1 0 - 0.1 K/uL  Comprehensive metabolic panel     Status: Abnormal   Collection Time: 08/30/17  3:29 PM  Result Value Ref Range   Sodium 136 135 - 145 mmol/L   Potassium 2.4 (LL) 3.5 - 5.1 mmol/L   Chloride 92 (L) 101 - 111 mmol/L   CO2 27 22 - 32 mmol/L   Glucose, Bld 413 (H) 65 - 99 mg/dL   BUN 65 (H) 6 - 20 mg/dL   Creatinine, Ser 0.86 (H) 0.61 - 1.24 mg/dL   Calcium 57.8 8.9 - 46.9 mg/dL   Total Protein 8.5 (H) 6.5 - 8.1 g/dL   Albumin 4.4 3.5 - 5.0 g/dL   AST 31 15 - 41  U/L   ALT 16 (L) 17 - 63 U/L   Alkaline Phosphatase 117 38 - 126 U/L   Total Bilirubin 0.9 0.3 - 1.2 mg/dL   GFR calc non Af Amer 28 (L) >60 mL/min   GFR calc Af Amer 33 (L) >60 mL/min   Anion gap 17 (H) 5 - 15  Troponin I     Status: Abnormal   Collection Time: 08/30/17  3:29 PM  Result Value Ref Range   Troponin I 0.05 (HH) <0.03 ng/mL  Type and screen [facility]     Status: None   Collection Time: 08/30/17  3:34 PM  Result Value Ref Range   ABO/RH(D) A POS    Antibody Screen NEG    Sample Expiration      09/02/2017 Performed at [facility], [address] [place], [place] [address]    ____________________________________________  EKG My review and personal interpretation at Time: 15:20   Indication: syncope  Rate: 90  Rhythm: aflutter with variable conduction and frequent pvc Axis: normal Other: non specific st abn ____________________________________________  RADIOLOGY  I personally reviewed all radiographic images ordered to evaluate for the above acute complaints and reviewed radiology reports and findings.  These findings were personally discussed with the patient.  Please see medical record for radiology report.  ____________________________________________   PROCEDURES  Procedure(s) performed:  .Critical Care Performed by: Willy Eddy, MD Authorized by: Willy Eddy, MD   Critical care provider statement:    Critical care time (minutes):  40   Critical care time was exclusive of:  Separately billable procedures and treating other patients   Critical care was necessary to treat or prevent imminent or life-threatening deterioration of the following conditions:  Cardiac failure   Critical care was time spent personally by me on the following activities:  Development of treatment plan with patient or surrogate, discussions with consultants, evaluation of patient's response to treatment, examination of patient, obtaining  history from  patient or surrogate, ordering and performing treatments and interventions, ordering and review of laboratory studies, ordering and review of radiographic studies, pulse oximetry, re-evaluation of patient's condition and review of old charts      Critical Care performed: yes ____________________________________________   INITIAL IMPRESSION / ASSESSMENT AND PLAN / ED COURSE  Pertinent labs & imaging results that were available during my care of the patient were reviewed by me and considered in my medical decision making (see chart for details).  DDX: Dysrhythmia, ACS, CVA, SAH, IPH, dehydration, lecture light abnormality, anemia  Wyatt [name] is a 67 y.o. who presents to the ED with multiple witnessed syncopal episodes as described above with frequent PVCs and even short runs of V. tach.  Patient is critically ill.  He was immediately placed on cardiac monitor as well as pacer pads.  Abdominal exam soft and benign.  Blood work was sent for the above differential.  Clinical Course as of Aug 31 1650  Thu Aug 30, 2017  1558 [facility] cardiology at bedside.  Patient remains hemodynamically stable at this time.  CTA head ordered to evaluate for evidence of bleed shows no hemorrhage.   [PR]    Clinical Course User Index [PR] Willy Eddy, MD   ----------------------------------------- 4:54 PM on 08/30/2017 -----------------------------------------  Patient remains hemodynamically stable.  Has been evaluated at bedside by Dr. Park Breed.  I have ordered IV lidocaine infusion.  Hemoglobin slightly improved from previous.  As part of my medical decision making, I reviewed the following data within the electronic MEDICAL RECORD NUMBER Nursing notes reviewed and incorporated, Labs reviewed, notes from prior ED visits and [place] Controlled Substance Database   ____________________________________________   FINAL CLINICAL IMPRESSION(S) / ED DIAGNOSES  Final diagnoses:  Syncope and  collapse  Ventricular tachycardia (HCC)      NEW MEDICATIONS STARTED DURING THIS VISIT:  New Prescriptions   No medications on file     Note:  This document was prepared using Dragon voice recognition software and may include unintentional dictation errors.    Willy Eddy, MD 08/30/17 1945

## 2017-08-30 NOTE — ED Notes (Signed)
A few minutes after arrival patient states having a funny feeling and had a brief syncopal episode and then vomited while showing v-tach on the monitor.  Patient placed on defibrillator pads and MD called to bedside.  Patient quickly became alert and oriented again.

## 2017-08-30 NOTE — Consult Note (Signed)
Wyatt [name] is a 67 y.o. male  [id number]  Primary Cardiologist:Wyatt [name] Reason for Consultation: Syncope   HPI: 67 year old white male with a past medical history of coronary artery disease status post PCI and drug-eluting stent.  He has renal insufficiency with baseline creatinine about 2.3 and anemia as well as atrial flutter.  He had atrial flutter requiring amiodarone p.o. but was stopped because his ventricular rate dropped to as low as 38 on Holter monitor associated with presyncope.  He was already seeing Dr. Thornton ParkAlex [name] and was supposed to have a pacemaker this coming Tuesday.  He had 3 episodes of presyncope where he completely blacks out and then recovers within a minute or 2 and one episode was witnessed by me in the office lasting 2 minutes and the monitor showed atrial flutter about 47 bpm.  In the ER he had nonsustained ventricular tachycardia also.     Review of Systems: No chest pain   Past Medical History:  Diagnosis Date  . Anginal pain (HCC)   . Arthritis   . Atrial fibrillation (HCC)   . Chronic kidney disease   . Diabetes mellitus without complication (HCC)   . Enlarged prostate   . GI bleed   . High cholesterol   . Hypertension   . Liver lesion   . Shortness of breath dyspnea      (Not in a hospital admission)     Infusions: . sodium chloride 500 mL (08/30/17 1542)    Allergies  Allergen Reactions  . Niacin And Related Other (See Comments)    Flushing  . Paroxetine Hcl Itching    Social History   Socioeconomic History  . Marital status: Single    Spouse name: Not on file  . Number of children: Not on file  . Years of education: Not on file  . Highest education level: Not on file  Occupational History  . Not on file  Social Needs  . Financial resource strain: Not on file  . Food insecurity:    Worry: Not on file    Inability: Not on file  . Transportation needs:    Medical: Not on file    Non-medical: Not on file   Tobacco Use  . Smoking status: Former Smoker    Packs/day: 0.00    Years: 0.00    [place] years: 0.00    Last attempt to quit: 07/29/1969    Years since quitting: 48.1  . Smokeless tobacco: Former Engineer, waterUser  Substance and Sexual Activity  . Alcohol use: Yes    Alcohol/week: 0.6 oz    Types: 1 Cans of beer per week  . Drug use: No  . Sexual activity: Not on file  Lifestyle  . Physical activity:    Days per week: Not on file    Minutes per session: Not on file  . Stress: Not on file  Relationships  . Social connections:    Talks on phone: Not on file    Gets together: Not on file    Attends religious service: Not on file    Active member of club or organization: Not on file    Attends meetings of clubs or organizations: Not on file    Relationship status: Not on file  . Intimate partner violence:    Fear of current or ex partner: Not on file    Emotionally abused: Not on file    Physically abused: Not on file    Forced sexual activity: Not on  file  Other Topics Concern  . Not on file  Social History Narrative  . Not on file    Family History  Problem Relation Age of Onset  . CAD Mother   . Dementia Mother   . Parkinson's disease Mother   . CAD Father     PHYSICAL EXAM: Vitals:   08/30/17 1537  BP: (!) 162/68  Pulse: (!) 57  Resp: (!) 21  Temp: (!) 97.5 F (36.4 C)  SpO2: 97%    No intake or output data in the 24 hours ending 08/30/17 1551  General:  Well appearing. No respiratory difficulty HEENT: normal Neck: supple. no JVD. Carotids 2+ bilat; no bruits. No lymphadenopathy or thryomegaly appreciated. Cor: PMI nondisplaced. Regular rate & rhythm. No rubs, gallops or murmurs. Lungs: clear Abdomen: soft, nontender, nondistended. No hepatosplenomegaly. No bruits or masses. Good bowel sounds. Extremities: no cyanosis, clubbing, rash, edema Neuro: alert & oriented x 3, cranial nerves grossly intact. moves all 4 extremities w/o difficulty. Affect pleasant.  ECG:  Atrial flutter with ventricular rate very slow about 35-47 bpm  Results for orders placed or performed during the hospital encounter of 08/30/17 (from the past 24 hour(s))  CBC with Differential/Platelet     Status: Abnormal   Collection Time: 08/30/17  3:29 PM  Result Value Ref Range   WBC 8.6 3.8 - 10.6 K/uL   RBC 4.43 4.40 - 5.90 MIL/uL   Hemoglobin 9.9 (L) 13.0 - 18.0 g/dL   HCT 16.1 (L) 09.6 - 04.5 %   MCV 71.3 (L) 80.0 - 100.0 fL   MCH 22.4 (L) 26.0 - 34.0 pg   MCHC 31.4 (L) 32.0 - 36.0 g/dL   RDW 40.9 (H) 81.1 - 91.4 %   Platelets 203 150 - 440 K/uL   Neutrophils Relative % 77 %   Neutro Abs 6.7 (H) 1.4 - 6.5 K/uL   Lymphocytes Relative 11 %   Lymphs Abs 0.9 (L) 1.0 - 3.6 K/uL   Monocytes Relative 9 %   Monocytes Absolute 0.8 0.2 - 1.0 K/uL   Eosinophils Relative 2 %   Eosinophils Absolute 0.1 0 - 0.7 K/uL   Basophils Relative 1 %   Basophils Absolute 0.1 0 - 0.1 K/uL   No results found.   ASSESSMENT AND PLAN: Syncopal episode secondary to slow ventricular response rate and atrial flutter.  Patient also had a run of nonsustained ventricular tachycardia.  Patient was scheduled to have a permanent pacemaker this Tuesday and advise getting it done this admission.  Patient also has renal insufficiency with baseline creatinine 2.3 and history of atrial flutter and GI bleed and Eliquis and Plavix had to be stopped.  Will put in consult for Dr. Thornton [name] for permanent pacemaker and in the meantime have external pads for external pacing.  May consider dopamine to improve the heart rate as well as blood pressure if needed.  Wyatt [name] A

## 2017-08-30 NOTE — ED Notes (Signed)
This RN to transport pt to ICU3.

## 2017-08-30 NOTE — ED Triage Notes (Signed)
Pt to ED via EMS from Dr. Milta DeitersKhan's office c/o syncope and irregular heart beat.  Pt has hx of a.fib, stent placement, and DBM, EKG done at office and showed bifascicular block.  Pt to have pacemaker placed on Tuesday by Dr. Darrold JunkerParaschos.  Pt states syncope last night, today, and at office.  Denies pain, states SOB and nausea.

## 2017-08-30 NOTE — Progress Notes (Signed)
Advise lidocain for V.Tach as has slow VR with atrial flutter cannot take amiodrone.

## 2017-08-30 NOTE — Consult Note (Signed)
[facility] Cardiology  CARDIOLOGY CONSULT NOTE  Patient ID: Wyatt BradleyMichael G Hundertmark MRN: [id number] DOB/AGE: 65/10/1950 65 y.o.  Admit date: 08/30/2017 Referring Physician Randa EvensSalry Primary Physician [facility]Davis Primary Cardiologist Welton FlakesKhan Reason for Consultation bradycardia  HPI: 67 year old gentleman referred for evaluation of bradycardia and possible pacemaker.  The patient has known coronary artery disease, status post coronary stents, chronic atrial fibrillation/atrial flutter, with recent history of symptomatic bradycardia, scheduled for pacemaker next week.  The patient presents with recurrent syncopal episodes, witnessed today at Dr. Milta DeitersKhan's office, [facility] room where he apparently had a 15 beat run of wide-complex tachycardia.  Patient placed on lidocaine drip because of prior history of bradycardia on amiodarone.  Of note, admission labs reveal potassium of 2.4.  Troponin was 0.05.  The patient denies chest pain.  Patient currently in atrial flutter at a rate of 50-60 bpm.  Review of systems complete and found to be negative unless listed above     Past Medical History:  Diagnosis Date  . Anginal pain (HCC)   . Arthritis   . Atrial fibrillation (HCC)   . Chronic kidney disease   . Diabetes mellitus without complication (HCC)   . Enlarged prostate   . GI bleed   . High cholesterol   . Hypertension   . Liver lesion   . Shortness of breath dyspnea     Past Surgical History:  Procedure Laterality Date  . CARDIAC CATHETERIZATION Left 07/30/2015   Procedure: Left Heart Cath and Coronary Angiography;  Surgeon: Laurier NancyShaukat A Khan, MD;  Location: [facility] INVASIVE CV LAB;  Service: Cardiovascular;  Laterality: Left;  . CARDIAC CATHETERIZATION N/A 07/30/2015   Procedure: Coronary Stent Intervention;  Surgeon: Marcina MillardAlexander Leontina Skidmore, MD;  Location: [facility] INVASIVE CV LAB;  Service: Cardiovascular;  Laterality: N/A;  . CARDIAC CATHETERIZATION Left 11/30/2015   Procedure: Left Heart Cath and Coronary  Angiography;  Surgeon: Laurier NancyShaukat A Khan, MD;  Location: [facility] INVASIVE CV LAB;  Service: Cardiovascular;  Laterality: Left;  . CARDIAC CATHETERIZATION N/A 11/30/2015   Procedure: Coronary Stent Intervention;  Surgeon: Alwyn Peawayne D Callwood, MD;  Location: [facility] INVASIVE CV LAB;  Service: Cardiovascular;  Laterality: N/A;  . kidney stones    . KNEE ARTHROCENTESIS Right      (Not in a hospital admission) Social History   Socioeconomic History  . Marital status: Single    Spouse name: Not on file  . Number of children: Not on file  . Years of education: Not on file  . Highest education level: Not on file  Occupational History  . Not on file  Social Needs  . Financial resource strain: Not on file  . Food insecurity:    Worry: Not on file    Inability: Not on file  . Transportation needs:    Medical: Not on file    Non-medical: Not on file  Tobacco Use  . Smoking status: Former Smoker    Packs/day: 0.00    Years: 0.00    [place]ker years: 0.00    Last attempt to quit: 07/29/1969    Years since quitting: 48.1  . Smokeless tobacco: Former Engineer, waterUser  Substance and Sexual Activity  . Alcohol use: Yes    Alcohol/week: 0.6 oz    Types: 1 Cans of beer per week  . Drug use: No  . Sexual activity: Not on file  Lifestyle  . Physical activity:    Days per week: Not on file    Minutes per session: Not on file  . Stress: Not  on file  Relationships  . Social connections:    Talks on phone: Not on file    Gets together: Not on file    Attends religious service: Not on file    Active member of club or organization: Not on file    Attends meetings of clubs or organizations: Not on file    Relationship status: Not on file  . Intimate partner violence:    Fear of current or ex partner: Not on file    Emotionally abused: Not on file    Physically abused: Not on file    Forced sexual activity: Not on file  Other Topics Concern  . Not on file  Social History Narrative  . Not on file    Family History   Problem Relation Age of Onset  . CAD Mother   . Dementia Mother   . Parkinson's disease Mother   . CAD Father       Review of systems complete and found to be negative unless listed above      PHYSICAL EXAM  General: Well developed, well nourished, in no acute distress HEENT:  Normocephalic and atramatic Neck:  No JVD.  Lungs: Clear bilaterally to auscultation and percussion. Heart: HRRR . Normal S1 and S2 without gallops or murmurs.  Abdomen: Bowel sounds are positive, abdomen soft and non-tender  Msk:  Back normal, normal gait. Normal strength and tone for age. Extremities: No clubbing, cyanosis or edema.   Neuro: Alert and oriented X 3. Psych:  Good affect, responds appropriately  Labs:   Lab Results  Component Value Date   WBC 8.6 08/30/2017   HGB 9.9 (L) 08/30/2017   HCT 31.6 (L) 08/30/2017   MCV 71.3 (L) 08/30/2017   PLT 203 08/30/2017   Recent Labs  Lab 08/30/17 1529  NA 136  K 2.4*  CL 92*  CO2 27  BUN 65*  CREATININE 2.30*  CALCIUM 10.1  PROT 8.5*  BILITOT 0.9  ALKPHOS 117  ALT 16*  AST 31  GLUCOSE 413*   Lab Results  Component Value Date   TROPONINI 0.05 (HH) 08/30/2017    Lab Results  Component Value Date   CHOL 106 11/30/2015   Lab Results  Component Value Date   HDL 27 (L) 11/30/2015   Lab Results  Component Value Date   LDLCALC UNABLE TO REPORT DUE TO LIPEMIC INTERFERENCE 11/30/2015   Lab Results  Component Value Date   TRIG 423 (H) 11/30/2015   Lab Results  Component Value Date   CHOLHDL 3.9 11/30/2015   No results found for: LDLDIRECT    Radiology: Ct Head Wo Contrast  Result Date: 08/30/2017 CLINICAL DATA:  Syncope. EXAM: CT HEAD WITHOUT CONTRAST TECHNIQUE: Contiguous axial images were obtained from the base of the skull through the vertex without intravenous contrast. COMPARISON:  CT head dated June 24, 2013. FINDINGS: Brain: No evidence of acute infarction, hemorrhage, hydrocephalus, extra-axial collection or mass  lesion/mass effect. Mild age related cerebral and cerebellar atrophy, unchanged. Vascular: No hyperdense vessel or unexpected calcification. Skull: Normal. Negative for fracture or focal lesion. Sinuses/Orbits: No acute finding. Other: None. IMPRESSION: 1.  No acute intracranial abnormality. Electronically Signed   By: Obie Dredge M.D.   On: 08/30/2017 15:57    EKG: Atrial flutter  ASSESSMENT AND PLAN:   1.  Bradycardia, with atrial flutter with slow ventricular rate, currently appears hemodynamically stable 2.  Wide-complex tachycardia, nonsustained polymorphic VT, likely exacerbated by hypokalemia 3.  Known CAD, status post  multiple coronary stents, in the absence of chest pain, borderline elevated troponin, likely demand supply ischemia, exacerbated by underlying chronic kidney disease and wide-complex tachycardia  Recommendations  1.  Agree with overall current therapy 2.  Continue lidocaine drip for now 3.  Replete potassium 4.  Replete magnesium 5.  Will proceed with single-chamber pacemaker during this hospitalization  Signed: Marcina Millard MD,PhD, [facility] 08/30/2017, 5:23 PM

## 2017-08-30 NOTE — Progress Notes (Signed)
Family Meeting Note  Advance Directive:yes  Today a meeting took place with the Patient, significant other, family friend.  Patient is able to participate  The following clinical team members were present during this meeting:MD  The following were discussed:Patient's diagnosis: , Patient's progosis: > 12 months and Goals for treatment: Full Code  Additional follow-up to be provided: prn  Time spent during discussion:20 minutes  Bertrum SolMontell D Salary, MD

## 2017-08-30 NOTE — ED Notes (Signed)
Patient transported to CT 

## 2017-08-31 ENCOUNTER — Encounter: Payer: Self-pay | Admitting: Emergency Medicine

## 2017-08-31 ENCOUNTER — Encounter
Admission: EM | Disposition: A | Discharge status: Home / Self Care | Payer: Self-pay | Source: Home / Self Care | Attending: Specialist

## 2017-08-31 DIAGNOSIS — R55 Syncope and collapse: Secondary | ICD-10-CM

## 2017-08-31 DIAGNOSIS — E876 Hypokalemia: Secondary | ICD-10-CM

## 2017-08-31 DIAGNOSIS — E1100 Type 2 diabetes mellitus with hyperosmolarity without nonketotic hyperglycemic-hyperosmolar coma (NKHHC): Secondary | ICD-10-CM

## 2017-08-31 DIAGNOSIS — I472 Ventricular tachycardia: Secondary | ICD-10-CM

## 2017-08-31 DIAGNOSIS — I4892 Unspecified atrial flutter: Secondary | ICD-10-CM

## 2017-08-31 HISTORY — PX: PACEMAKER LEADLESS INSERTION: EP1219

## 2017-08-31 LAB — BASIC METABOLIC PANEL
Anion gap: 13 (ref 5–15)
BUN: 59 mg/dL — AB (ref 6–20)
CHLORIDE: 99 mmol/L — AB (ref 101–111)
CO2: 28 mmol/L (ref 22–32)
Calcium: 9.7 mg/dL (ref 8.9–10.3)
Creatinine, Ser: 2.19 mg/dL — ABNORMAL HIGH (ref 0.61–1.24)
GFR calc non Af Amer: 30 mL/min — ABNORMAL LOW (ref >60)
GFR, EST AFRICAN AMERICAN: 35 mL/min — AB (ref >60)
Glucose, Bld: 206 mg/dL — ABNORMAL HIGH (ref 65–99)
POTASSIUM: 3.2 mmol/L — AB (ref 3.5–5.1)
SODIUM: 140 mmol/L (ref 135–145)

## 2017-08-31 LAB — CBC
HCT: 30.2 % — ABNORMAL LOW (ref 40.0–52.0)
Hemoglobin: 9.5 g/dL — ABNORMAL LOW (ref 13.0–18.0)
MCH: 22.5 pg — AB (ref 26.0–34.0)
MCHC: 31.3 g/dL — ABNORMAL LOW (ref 32.0–36.0)
MCV: 71.8 fL — AB (ref 80.0–100.0)
Platelets: 183 10*3/uL (ref 150–440)
RBC: 4.21 MIL/uL — AB (ref 4.40–5.90)
RDW: 22.4 % — ABNORMAL HIGH (ref 11.5–14.5)
WBC: 6.8 10*3/uL (ref 3.8–10.6)

## 2017-08-31 LAB — ECHOCARDIOGRAM COMPLETE
HEIGHTINCHES: 74 in
WEIGHTICAEL: 3908.31 [oz_av]

## 2017-08-31 LAB — LIPID PANEL
Cholesterol: 85 mg/dL (ref 0–200)
HDL: 26 mg/dL — AB (ref >40)
LDL Cholesterol: 42 mg/dL (ref 0–99)
TRIGLYCERIDES: 83 mg/dL (ref <150)
Total CHOL/HDL Ratio: 3.3 RATIO
VLDL: 17 mg/dL (ref 0–40)

## 2017-08-31 LAB — MAGNESIUM: Magnesium: 2.7 mg/dL — ABNORMAL HIGH (ref 1.7–2.4)

## 2017-08-31 LAB — GLUCOSE, CAPILLARY
GLUCOSE-CAPILLARY: 190 mg/dL — AB (ref 65–99)
GLUCOSE-CAPILLARY: 198 mg/dL — AB (ref 65–99)
GLUCOSE-CAPILLARY: 224 mg/dL — AB (ref 65–99)
GLUCOSE-CAPILLARY: 212 mg/dL — AB (ref 65–99)
Glucose-Capillary: 180 mg/dL — ABNORMAL HIGH (ref 65–99)

## 2017-08-31 LAB — HEMOGLOBIN A1C
HEMOGLOBIN A1C: 7.8 % — AB (ref 4.8–5.6)
Mean Plasma Glucose: 177.16 mg/dL

## 2017-08-31 LAB — POTASSIUM
Potassium: 3.2 mmol/L — ABNORMAL LOW (ref 3.5–5.1)
Potassium: 3.3 mmol/L — ABNORMAL LOW (ref 3.5–5.1)

## 2017-08-31 LAB — TROPONIN I
TROPONIN I: 0.05 ng/mL — AB (ref <0.03)
Troponin I: 0.05 ng/mL (ref <0.03)

## 2017-08-31 SURGERY — INSERTION, CARDIAC PACEMAKER
Anesthesia: Moderate Sedation | Laterality: Left

## 2017-08-31 SURGERY — INSERTION, CARDIAC PACEMAKER
Anesthesia: Choice

## 2017-08-31 SURGERY — PACEMAKER LEADLESS INSERTION
Anesthesia: Moderate Sedation

## 2017-08-31 MED ORDER — HEPARIN SODIUM (PORCINE) 1000 UNIT/ML IJ SOLN
INTRAMUSCULAR | Status: AC
  Filled 2017-08-31: qty 1

## 2017-08-31 MED ORDER — POTASSIUM CHLORIDE CRYS ER 20 MEQ PO TBCR: 40.0000 meq | EXTENDED_RELEASE_TABLET | Freq: Once | ORAL | Status: DC

## 2017-08-31 MED ORDER — ACETAMINOPHEN 325 MG PO TABS: 325.0000 mg | ORAL_TABLET | ORAL | Status: DC | PRN

## 2017-08-31 MED ORDER — HEPARIN (PORCINE) IN NACL 2-0.9 UNIT/ML-% IJ SOLN
INTRAMUSCULAR | Status: AC
  Filled 2017-08-31: qty 1000

## 2017-08-31 MED ORDER — SODIUM CHLORIDE 0.9 % IR SOLN: 80.0000 mg | Status: DC

## 2017-08-31 MED ORDER — HEPARIN SODIUM (PORCINE) 1000 UNIT/ML IJ SOLN
INTRAMUSCULAR | Status: DC | PRN
  Administered 2017-08-31: 5000 [IU] via INTRAVENOUS

## 2017-08-31 MED ORDER — MIDAZOLAM HCL 2 MG/2ML IJ SOLN
INTRAMUSCULAR | Status: AC
  Filled 2017-08-31: qty 2

## 2017-08-31 MED ORDER — IOPAMIDOL (ISOVUE-300) INJECTION 61%
INTRAVENOUS | Status: DC | PRN
  Administered 2017-08-31: 20 mL via INTRA_ARTERIAL

## 2017-08-31 MED ORDER — APIXABAN 5 MG PO TABS
5.0000 mg | ORAL_TABLET | Freq: Two times a day (BID) | ORAL | Status: DC
  Administered 2017-08-31 – 2017-09-01 (×2): 5 mg via ORAL
  Filled 2017-08-31 (×2): qty 1

## 2017-08-31 MED ORDER — FENTANYL CITRATE (PF) 100 MCG/2ML IJ SOLN
INTRAMUSCULAR | Status: AC
  Filled 2017-08-31: qty 2

## 2017-08-31 MED ORDER — ONDANSETRON HCL 4 MG/2ML IJ SOLN: 4.0000 mg | Freq: Four times a day (QID) | INTRAMUSCULAR | Status: DC | PRN

## 2017-08-31 MED ORDER — MIDAZOLAM HCL 2 MG/2ML IJ SOLN
INTRAMUSCULAR | Status: DC | PRN
  Administered 2017-08-31: 1 mg via INTRAVENOUS

## 2017-08-31 MED ORDER — FENTANYL CITRATE (PF) 100 MCG/2ML IJ SOLN
INTRAMUSCULAR | Status: DC | PRN
  Administered 2017-08-31: 50 ug via INTRAVENOUS

## 2017-08-31 MED ORDER — SODIUM CHLORIDE 0.9 % IV SOLN: INTRAVENOUS | Status: DC

## 2017-08-31 SURGICAL SUPPLY — 31 items
BAG DECANTER FOR FLEXI CONT (MISCELLANEOUS) ×3 IMPLANT
BRUSH SCRUB EZ  4% CHG (MISCELLANEOUS) ×2
BRUSH SCRUB EZ 4% CHG (MISCELLANEOUS) ×1 IMPLANT
CABLE SURG 12 DISP A/V CHANNEL (MISCELLANEOUS) ×3 IMPLANT
CANISTER SUCT 1200ML W/VALVE (MISCELLANEOUS) ×3 IMPLANT
CHLORAPREP W/TINT 26ML (MISCELLANEOUS) ×3 IMPLANT
COVER LIGHT HANDLE STERIS (MISCELLANEOUS) ×6 IMPLANT
COVER MAYO STAND STRL (DRAPES) ×3 IMPLANT
DRAPE C-ARM XRAY 36X54 (DRAPES) ×3 IMPLANT
DRSG TEGADERM 4X4.75 (GAUZE/BANDAGES/DRESSINGS) ×3 IMPLANT
DRSG TELFA 4X3 1S NADH ST (GAUZE/BANDAGES/DRESSINGS) ×3 IMPLANT
ELECT REM PT RETURN 9FT ADLT (ELECTROSURGICAL) ×3
ELECTRODE REM PT RTRN 9FT ADLT (ELECTROSURGICAL) ×1 IMPLANT
GLOVE BIO SURGEON STRL SZ7.5 (GLOVE) ×3 IMPLANT
GLOVE BIO SURGEON STRL SZ8 (GLOVE) ×3 IMPLANT
GOWN STRL REUS W/ TWL LRG LVL3 (GOWN DISPOSABLE) ×1 IMPLANT
GOWN STRL REUS W/ TWL XL LVL3 (GOWN DISPOSABLE) ×1 IMPLANT
GOWN STRL REUS W/TWL LRG LVL3 (GOWN DISPOSABLE) ×2
GOWN STRL REUS W/TWL XL LVL3 (GOWN DISPOSABLE) ×2
IMMOBILIZER SHDR MD LX WHT (SOFTGOODS) IMPLANT
IMMOBILIZER SHDR XL LX WHT (SOFTGOODS) IMPLANT
INTRO PACEMKR SHEATH II 7FR (MISCELLANEOUS) ×3
INTRODUCER PACEMKR SHTH II 7FR (MISCELLANEOUS) ×1 IMPLANT
IV NS 500ML (IV SOLUTION) ×2
IV NS 500ML BAXH (IV SOLUTION) ×1 IMPLANT
KIT TURNOVER KIT A (KITS) ×3 IMPLANT
LABEL OR SOLS (LABEL) ×3 IMPLANT
MARKER SKIN DUAL TIP RULER LAB (MISCELLANEOUS) ×3 IMPLANT
PACK PACE INSERTION (MISCELLANEOUS) ×3 IMPLANT
PAD ONESTEP ZOLL R SERIES ADT (MISCELLANEOUS) ×3 IMPLANT
SUT SILK 0 SH 30 (SUTURE) ×9 IMPLANT

## 2017-08-31 SURGICAL SUPPLY — 13 items
DILATOR VESSEL 38 20CM 12FR (INTRODUCER) ×3 IMPLANT
DILATOR VESSEL 38 20CM 14FR (INTRODUCER) ×3 IMPLANT
DILATOR VESSEL 38 20CM 18FR (INTRODUCER) ×3 IMPLANT
DILATOR VESSEL 38 20CM 8FR (INTRODUCER) ×3 IMPLANT
GUIDEWIRE SUPER STIFF .035X180 (WIRE) ×3 IMPLANT
MICRA INTRODUCER SHEATH (SHEATH) ×3
MICRA TRANSCATH PACING SYSTEM (Pacemaker) ×3 IMPLANT
NEEDLE PERC 18GX7CM (NEEDLE) ×3 IMPLANT
SHEATH INTRODUCER MICRA (SHEATH) ×1 IMPLANT
SHEATH PINNACLE 7F 10CM (SHEATH) ×3 IMPLANT
SUT SILK 0 FSL (SUTURE) ×3 IMPLANT
SYSTEM PACING TRNSCTH MICRA (Pacemaker) ×1 IMPLANT
WIRE GUIDERIGHT .035X150 (WIRE) ×3 IMPLANT

## 2017-08-31 NOTE — Progress Notes (Signed)
[facility] Cardiology  SUBJECTIVE: Patient laying comfortably in bed, denies chest pain, shortness of breath, presyncope or syncope   Vitals:   08/31/17 0200 08/31/17 0300 08/31/17 0400 08/31/17 0500  BP: 119/75 (!) 115/57 120/60 116/62  Pulse: (!) 51 (!) 43 (!) 46 (!) 45  Resp: 16 17 13  (!) 23  Temp:    98.3 F (36.8 C)  TempSrc:    Oral  SpO2: 99% 99% 98% 100%  Weight:    111 kg (244 lb 11.4 oz)  Height:         Intake/Output Summary (Last 24 hours) at 08/31/2017 0743 Last data filed at 08/31/2017 0550 Gross per 24 hour  Intake 1555.58 ml  Output 1775 ml  Net -219.42 ml      PHYSICAL EXAM  General: Well developed, well nourished, in no acute distress HEENT:  Normocephalic and atramatic Neck:  No JVD.  Lungs: Clear bilaterally to auscultation and percussion. Heart: HRRR . Normal S1 and S2 without gallops or murmurs.  Abdomen: Bowel sounds are positive, abdomen soft and non-tender  Msk:  Back normal, normal gait. Normal strength and tone for age. Extremities: No clubbing, cyanosis or edema.   Neuro: Alert and oriented X 3. Psych:  Good affect, responds appropriately   LABS: Basic Metabolic Panel: Recent Labs    08/30/17 1715 08/30/17 2055 08/30/17 2321 08/31/17 0143 08/31/17 0348  NA  --  138  --   --  140  K  --  2.9*  --  3.2* 3.2*  CL  --  95*  --   --  99*  CO2  --  29  --   --  28  GLUCOSE  --  306*  --   --  206*  BUN  --  66*  --   --  59*  CREATININE  --  2.20*  --   --  2.19*  CALCIUM  --  10.0  --   --  9.7  MG 2.8*  --  2.7*  --   --    Liver Function Tests: Recent Labs    08/30/17 1529  AST 31  ALT 16*  ALKPHOS 117  BILITOT 0.9  PROT 8.5*  ALBUMIN 4.4   No results for input(s): LIPASE, AMYLASE in the last 72 hours. CBC: Recent Labs    08/30/17 1529 08/31/17 0348  WBC 8.6 6.8  NEUTROABS 6.7*  --   HGB 9.9* 9.5*  HCT 31.6* 30.2*  MCV 71.3* 71.8*  PLT 203 183   Cardiac Enzymes: Recent Labs    08/30/17 1715 08/30/17 2321  08/31/17 0143  TROPONINI 0.06* 0.05* 0.05*   BNP: Invalid input(s): POCBNP D-Dimer: No results for input(s): DDIMER in the last 72 hours. Hemoglobin A1C: No results for input(s): HGBA1C in the last 72 hours. Fasting Lipid Panel: Recent Labs    08/31/17 0348  CHOL 85  HDL 26*  LDLCALC 42  TRIG 83  CHOLHDL 3.3   Thyroid Function Tests: Recent Labs    08/30/17 2055  TSH 0.657   Anemia Panel: No results for input(s): VITAMINB12, FOLATE, FERRITIN, TIBC, IRON, RETICCTPCT in the last 72 hours.  Ct Head Wo Contrast  Result Date: 08/30/2017 CLINICAL DATA:  Syncope. EXAM: CT HEAD WITHOUT CONTRAST TECHNIQUE: Contiguous axial images were obtained from the base of the skull through the vertex without intravenous contrast. COMPARISON:  CT head dated June 24, 2013. FINDINGS: Brain: No evidence of acute infarction, hemorrhage, hydrocephalus, extra-axial collection or mass lesion/mass effect. Mild age  related cerebral and cerebellar atrophy, unchanged. Vascular: No hyperdense vessel or unexpected calcification. Skull: Normal. Negative for fracture or focal lesion. Sinuses/Orbits: No acute finding. Other: None. IMPRESSION: 1.  No acute intracranial abnormality. Electronically Signed   By: Obie DredgeWilliam T Derry M.D.   On: 08/30/2017 15:57     Echo pending  TELEMETRY: Atrial flutter at a rate of 45 bpm:  ASSESSMENT AND PLAN:  Active Problems:   Tachy-brady syndrome (HCC)    1.  Chronic atrial flutter with a slow ventricular rate, hemodynamically stable 2.  Wide-complex tachycardia, nonsustained polymorphic VT, in the setting of hypokalemia, without recurrence, on lidocaine 3.  Known CAD, status post multiple coronary stents, no chest pain, borderline elevated troponin, likely demand supply ischemia, exacerbated by underlying chronic kidney disease, and nonsustained polymorphic VT  Recommendations  1.  Agree with current therapy 2.  Continue lidocaine drip for now 3.  Replete potassium  and magnesium 4.  Proceed with single-chamber pacemaker.  Per patient's wishes, will proceed with leadless Micra ventricular pacemaker.  The risks, benefits alternatives of malleolus micro-ventricular pacemaker implantation were explained to the patient and informed written consent was obtained.   Marcina MillardAlexander Rozalyn Osland, MD, PhD, [facility] 08/31/2017 7:43 AM

## 2017-08-31 NOTE — Progress Notes (Signed)
At bedtime this evening patient requested CPAP for sleep. CPAP was placed on patient for the night. Patient called me into room and asked for the mask to be taken off because of the noise it made. Education was given to the patient regarding that he may have his family bring in his personal CPAP machine. Patient voiced understanding. 2 Liters [place] placed on patient.

## 2017-08-31 NOTE — Progress Notes (Addendum)
Name: Wyatt [name] MRN: [id number] DOB: 04/24/1951     CONSULTATION DATE: 08/30/2017  Subjective  No major issues last night  Objectives: On Lidocaine gtt and awaiting placement of pace maker today   HISTORY OF PRESENT ILLNESS:    PAST MEDICAL HISTORY :   has a past medical history of Anginal pain (HCC), Arthritis, Atrial fibrillation (HCC), Chronic kidney disease, Diabetes mellitus without complication (HCC), Enlarged prostate, GI bleed, High cholesterol, Hypertension, Liver lesion, and Shortness of breath dyspnea.  has a past surgical history that includes Cardiac catheterization (Left, 07/30/2015); Cardiac catheterization (N/A, 07/30/2015); Knee arthrocentesis (Right); kidney stones; Cardiac catheterization (Left, 11/30/2015); and Cardiac catheterization (N/A, 11/30/2015). Prior to Admission medications   Medication Sig Start Date End Date Taking? Authorizing Provider  allopurinol (ZYLOPRIM) 100 MG tablet Take 100 mg by mouth daily.   Yes [provider]  amiodarone (PACERONE) 200 MG tablet Take 200 mg by mouth 2 (two) times daily.   Yes [provider]  atorvastatin (LIPITOR) 40 MG tablet Take 1 tablet (40 mg total) by mouth at bedtime. Patient taking differently: Take 40 mg by mouth daily.  12/01/15  Yes Altamese DillingVachhani, Vaibhavkumar, MD  doxazosin (CARDURA) 4 MG tablet Take 4 mg by mouth daily.    Yes [provider]  ferrous sulfate 325 (65 FE) MG tablet Take 325 mg by mouth daily.   Yes [provider]  finasteride (PROSCAR) 5 MG tablet Take 5 mg by mouth daily.   Yes [provider]  glipiZIDE (GLUCOTROL) 5 MG tablet Take 5 mg by mouth 2 (two) times daily before a meal.   Yes [provider]  hydrALAZINE (APRESOLINE) 25 MG tablet Take 25 mg by mouth daily.   Yes [provider]  isosorbide mononitrate (IMDUR) 60 MG 24 hr tablet Take 60 mg by mouth daily.   Yes [provider]  metolazone (ZAROXOLYN) 5 MG tablet Take  5 mg by mouth daily.   Yes [provider]  Multiple Vitamin (MULTIVITAMIN WITH MINERALS) TABS tablet Take 1 tablet by mouth daily.   Yes [provider]  nitroGLYCERIN (NITROSTAT) 0.4 MG SL tablet Place 0.4 mg under the tongue every 5 (five) minutes as needed for chest pain.   Yes [provider]  pantoprazole (PROTONIX) 20 MG tablet Take 20-40 mg by mouth daily.   Yes [provider]  potassium chloride (K-DUR,KLOR-CON) 10 MEQ tablet Take 20 mEq by mouth 2 (two) times daily.  08/30/17  Yes [provider]  sucralfate (CARAFATE) 1 g tablet Take 1 g by mouth daily.   Yes [provider]  tamsulosin (FLOMAX) 0.4 MG CAPS capsule Take 0.4 mg by mouth daily.   Yes [provider]  torsemide (DEMADEX) 20 MG tablet Take 20 mg by mouth daily.   Yes [provider]  vitamin C (ASCORBIC ACID) 500 MG tablet Take 500 mg by mouth 2 (two) times daily.   Yes [provider]   Allergies  Allergen Reactions  . Niacin And Related Other (See Comments)    Flushing  . Paroxetine Hcl Itching    FAMILY HISTORY:  family history includes CAD in his father and mother; Dementia in his mother; Parkinson's disease in his mother. SOCIAL HISTORY:  reports that he quit smoking about 48 years ago. He smoked 0.00 packs per day for 0.00 years. He has quit using smokeless tobacco. He reports that he drinks about 0.6 oz of alcohol per week. He reports that he does not  use drugs.  REVIEW OF SYSTEMS:   Unable to obtain due to critical illness   VITAL SIGNS: Temp:  [97.5 F (36.4 C)-98.4 F (36.9 C)] 97.6 F (36.4 C) (03/29 0800) Pulse Rate:  [34-70] 43 (03/29 0800) Resp:  [13-28] 17 (03/29 0800) BP: (115-162)/(57-75) 131/66 (03/29 0800) SpO2:  [90 %-100 %] 90 % (03/29 0800) Weight:  [110.2 kg (243 lb)-111 kg (244 lb 11.4 oz)] 111 kg (244 lb 11.4 oz) (03/29 0500)  Physical Examination:  A + O x3 and no focal neuro deficits On [place], no  distress, able to talk in full sentences. BEAE and no rales. S1 & S2 audible and no murmur Benign abdomen with normal peristalses Ext: WNL and no edema  I personally reviewed lab work that was obtained in last 24 hrs. CXR Independently reviewed   ASSESSMENT / PLAN:  VT with syncope, A fib/Flutter  and tachy brady syndrome, h/oCAD s/p PCI. On Lidocaine gtt. CT head showed no acute intracranial abnormality. -Awaiting pace maker placement and management as per cardiology. -ASA + Amiodarone+ Statin -Follow with ECHO report.  Hypokalemia -Replete and monitor electrolytes.  DM -Glycemic control.  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   Anemia with h/o GI bleeding (GERD and esophagitis) -Monitor H & H and HB > 7 gm/dl.   Hepatic lesion -Neoplastic work up after acute illness with dischargeplan.  Full code  DVT & GI prophylaxis. Continue with supportive care  Critical care time 35 min

## 2017-08-31 NOTE — Progress Notes (Signed)
Pharmacy Electrolyte Monitoring Consult:  Pharmacy consulted to assist in monitoring and replacing electrolytes in this 67 y.o. male admitted on 08/30/2017 with Loss of Consciousness and Irregular Heart Beat   Labs:  Sodium (mmol/L)  Date Value  08/31/2017 140   Potassium (mmol/L)  Date Value  08/31/2017 3.2 (L)   Magnesium (mg/dL)  Date Value  40/98/119103/28/2019 2.7 (H)   Calcium (mg/dL)  Date Value  47/82/956203/29/2019 9.7   Albumin (g/dL)  Date Value  13/08/657803/28/2019 4.4    Plan: K 3.2; Replaced with scheduled KCl 20mEq PO BID. Will follow up with electrolytes with AM labs.  Pharmacy to continue to follow and monitor electrolytes per protocol.  Cleopatra CedarStephanie Mykhia Danish, PharmD Pharmacy Resident  08/31/2017 4:25 PM

## 2017-08-31 NOTE — Progress Notes (Signed)
SUBJECTIVE: patient is feeling much better no syncopal episodes since last night.   Vitals:   08/31/17 0200 08/31/17 0300 08/31/17 0400 08/31/17 0500  BP: 119/75 (!) 115/57 120/60 116/62  Pulse: (!) 51 (!) 43 (!) 46 (!) 45  Resp: 16 17 13  (!) 23  Temp:    98.3 F (36.8 C)  TempSrc:    Oral  SpO2: 99% 99% 98% 100%  Weight:    244 lb 11.4 oz (111 kg)  Height:        Intake/Output Summary (Last 24 hours) at 08/31/2017 0829 Last data filed at 08/31/2017 0550 Gross per 24 hour  Intake 1555.58 ml  Output 1775 ml  Net -219.42 ml    LABS: Basic Metabolic Panel: Recent Labs    08/30/17 1715 08/30/17 2055 08/30/17 2321 08/31/17 0143 08/31/17 0348  NA  --  138  --   --  140  K  --  2.9*  --  3.2* 3.2*  CL  --  95*  --   --  99*  CO2  --  29  --   --  28  GLUCOSE  --  306*  --   --  206*  BUN  --  66*  --   --  59*  CREATININE  --  2.20*  --   --  2.19*  CALCIUM  --  10.0  --   --  9.7  MG 2.8*  --  2.7*  --   --    Liver Function Tests: Recent Labs    08/30/17 1529  AST 31  ALT 16*  ALKPHOS 117  BILITOT 0.9  PROT 8.5*  ALBUMIN 4.4   No results for input(s): LIPASE, AMYLASE in the last 72 hours. CBC: Recent Labs    08/30/17 1529 08/31/17 0348  WBC 8.6 6.8  NEUTROABS 6.7*  --   HGB 9.9* 9.5*  HCT 31.6* 30.2*  MCV 71.3* 71.8*  PLT 203 183   Cardiac Enzymes: Recent Labs    08/30/17 1715 08/30/17 2321 08/31/17 0143  TROPONINI 0.06* 0.05* 0.05*   BNP: Invalid input(s): POCBNP D-Dimer: No results for input(s): DDIMER in the last 72 hours. Hemoglobin A1C: No results for input(s): HGBA1C in the last 72 hours. Fasting Lipid Panel: Recent Labs    08/31/17 0348  CHOL 85  HDL 26*  LDLCALC 42  TRIG 83  CHOLHDL 3.3   Thyroid Function Tests: Recent Labs    08/30/17 2055  TSH 0.657   Anemia Panel: No results for input(s): VITAMINB12, FOLATE, FERRITIN, TIBC, IRON, RETICCTPCT in the last 72 hours.   PHYSICAL EXAM General: Well developed, well  nourished, in no acute distress HEENT:  Normocephalic and atramatic Neck:  No JVD.  Lungs: Clear bilaterally to auscultation and percussion. Heart: HRRR . Normal S1 and S2 without gallops or murmurs.  Abdomen: Bowel sounds are positive, abdomen soft and non-tender  Msk:  Back normal, normal gait. Normal strength and tone for age. Extremities: No clubbing, cyanosis or edema.   Neuro: Alert and oriented X 3. Psych:  Good affect, responds appropriately  TELEMETRY: atrial flutter with ventricular rate about 42 bpm  ASSESSMENT AND PLAN: status post syncopal episodes due to potassium being 2.9 and right now is 3.2 with the episodes up to 15 beats of ventricular tachycardia leading to syncope repeatedly. Right now on lidocaine and had no further episode and potassium is being corrected and will have pacemaker implant this afternoon.  Active Problems:   Tachy-brady syndrome (HCC)  Wyatt [name],Wyatt Blyth A, MD, [facility] 08/31/2017 8:29 AM

## 2017-08-31 NOTE — Plan of Care (Signed)
  Problem: Education: Goal: Knowledge of General Education information will improve Outcome: Completed/Met   Problem: Clinical Measurements: Goal: Respiratory complications will improve Outcome: Not Applicable

## 2017-08-31 NOTE — Progress Notes (Signed)
Tomorrow can be discharged as has PPM and f/u my office next week friday 10 am.

## 2017-08-31 NOTE — Progress Notes (Signed)
[facility] - [place] at [facility]   PATIENT NAME: Sabastien [name]    MR#:  [id number]  DATE OF BIRTH:  1951-01-08  SUBJECTIVE:   Patient presented to the hospital due to tachybradycardia syndrome and noted to have symptomatic bradycardia.  Patient is status post pacemaker placement today.  Seen in recovery after pacemaker placement and denies any complaints.  Patient's family is at bedside.  REVIEW OF SYSTEMS:    Review of Systems  Constitutional: Negative for chills and fever.  HENT: Negative for congestion and tinnitus.   Eyes: Negative for blurred vision and double vision.  Respiratory: Negative for cough, shortness of breath and wheezing.   Cardiovascular: Negative for chest pain, orthopnea and PND.  Gastrointestinal: Negative for abdominal pain, diarrhea, nausea and vomiting.  Genitourinary: Negative for dysuria and hematuria.  Neurological: Negative for dizziness, sensory change and focal weakness.  All other systems reviewed and are negative.   Nutrition: Heart healthy Tolerating Diet: Yes Tolerating PT: Await Eval.   DRUG ALLERGIES:   Allergies  Allergen Reactions  . Niacin And Related Other (See Comments)    Flushing  . Paroxetine Hcl Itching    VITALS:  Blood pressure 128/71, pulse 60, temperature 98.2 F (36.8 C), temperature source Oral, resp. rate 16, height 6\' 2"  (1.88 m), weight 111 kg (244 lb 11.4 oz), SpO2 99 %.  PHYSICAL EXAMINATION:   Physical Exam  GENERAL:  67 y.o.-year-old patient lying in bed in no acute distress.  EYES: Pupils equal, round, reactive to light and accommodation. No scleral icterus. Extraocular muscles intact.  HEENT: Head atraumatic, normocephalic. Oropharynx and nasopharynx clear.  NECK:  Supple, no jugular venous distention. No thyroid enlargement, no tenderness.  LUNGS: Normal breath sounds bilaterally, no wheezing, rales, rhonchi. No use of accessory muscles of respiration.  CARDIOVASCULAR: S1, S2 normal. No  murmurs, rubs, or gallops.  ABDOMEN: Soft, nontender, nondistended. Bowel sounds present. No organomegaly or mass.  EXTREMITIES: No cyanosis, clubbing or edema b/l.    NEUROLOGIC: Cranial nerves II through XII are intact. No focal Motor or sensory deficits b/l.   PSYCHIATRIC: The patient is alert and oriented x 3.  SKIN: No obvious rash, lesion, or ulcer.    LABORATORY PANEL:   CBC Recent Labs  Lab 08/31/17 0348  WBC 6.8  HGB 9.5*  HCT 30.2*  PLT 183   ------------------------------------------------------------------------------------------------------------------  Chemistries  Recent Labs  Lab 08/30/17 1529  08/30/17 2321  08/31/17 0348  NA 136   < >  --   --  140  K 2.4*   < >  --    < > 3.2*  CL 92*   < >  --   --  99*  CO2 27   < >  --   --  28  GLUCOSE 413*   < >  --   --  206*  BUN 65*   < >  --   --  59*  CREATININE 2.30*   < >  --   --  2.19*  CALCIUM 10.1   < >  --   --  9.7  MG  --    < > 2.7*  --   --   AST 31  --   --   --   --   ALT 16*  --   --   --   --   ALKPHOS 117  --   --   --   --   BILITOT 0.9  --   --   --   --    < > =  values in this interval not displayed.   ------------------------------------------------------------------------------------------------------------------  Cardiac Enzymes Recent Labs  Lab 08/31/17 0143  TROPONINI 0.05*   ------------------------------------------------------------------------------------------------------------------  RADIOLOGY:  Ct Head Wo Contrast  Result Date: 08/30/2017 CLINICAL DATA:  Syncope. EXAM: CT HEAD WITHOUT CONTRAST TECHNIQUE: Contiguous axial images were obtained from the base of the skull through the vertex without intravenous contrast. COMPARISON:  CT head dated June 24, 2013. FINDINGS: Brain: No evidence of acute infarction, hemorrhage, hydrocephalus, extra-axial collection or mass lesion/mass effect. Mild age related cerebral and cerebellar atrophy, unchanged. Vascular: No hyperdense  vessel or unexpected calcification. Skull: Normal. Negative for fracture or focal lesion. Sinuses/Orbits: No acute finding. Other: None. IMPRESSION: 1.  No acute intracranial abnormality. Electronically Signed   By: Obie DredgeWilliam T Derry M.D.   On: 08/30/2017 15:57     ASSESSMENT AND PLAN:   67 year old male with past medical history of atrial fibrillation, chronic kidney disease stage III, diabetes, history of BPH, hypertension who presented to the hospital due to syncopal episode and noted to be in tachybradycardia syndrome.  1.  Acute tachybradycardia syndrome-patient has a history of chronic atrial fibrillation and presented to the hospital with initial tachycardia and then developed significant symptomatic bradycardia with syncope. -Patient's electrolytes were replaced, placed on a lidocaine drip.  Cardiology consult obtained.  Patient is presently status post pacemaker placement. -Presently hemodynamically stable and feels much better.  Next  2.  Syncope-secondary to the tachybradycardia syndrome.  No further episodes.   -Echocardiogram showing normal ejection fraction with no wall motion abnormalities.  3.  Chronic kidney disease stage III-patient's creatinine is close to baseline can be further followed as an outpatient.  No acute issue.  4.  BPH-no urinary retention. -Continue finasteride and Flomax.     5.  History of chronic atrial fibrillation- continue amiodarone  6.  GERD-continue Protonix.  7.  Hypokalemia -continue to supplement and repeat level in the morning.  8.  GERD-continue Protonix.  Likely discharge Home tomorrow.   All the records are reviewed and case discussed with Care Management/Social Worker. Management plans discussed with the patient, family and they are in agreement.  CODE STATUS: Full code  DVT Prophylaxis: Hep SQ  TOTAL TIME TAKING CARE OF THIS PATIENT: 30 minutes.   POSSIBLE D/C IN 1-2 DAYS, DEPENDING ON CLINICAL CONDITION.   Houston SirenSAINANI,Cleatus Goodin J M.D  on 08/31/2017 at 4:29 PM  Between 7am to 6pm - Pager - [phone]  After 6pm go to [email or web address] - Social research officer, governmentpassword [email or web address]  [facility] [place] [facility]  Office  [phone]  CC: Primary care physician; Carolynn ServeHinson, John, MD

## 2017-09-01 LAB — BASIC METABOLIC PANEL
ANION GAP: 13 (ref 5–15)
BUN: 53 mg/dL — ABNORMAL HIGH (ref 6–20)
CHLORIDE: 97 mmol/L — AB (ref 101–111)
CO2: 29 mmol/L (ref 22–32)
Calcium: 9.6 mg/dL (ref 8.9–10.3)
Creatinine, Ser: 2.25 mg/dL — ABNORMAL HIGH (ref 0.61–1.24)
GFR calc non Af Amer: 29 mL/min — ABNORMAL LOW (ref >60)
GFR, EST AFRICAN AMERICAN: 34 mL/min — AB (ref >60)
Glucose, Bld: 123 mg/dL — ABNORMAL HIGH (ref 65–99)
POTASSIUM: 3.5 mmol/L (ref 3.5–5.1)
Sodium: 139 mmol/L (ref 135–145)

## 2017-09-01 LAB — GLUCOSE, CAPILLARY: GLUCOSE-CAPILLARY: 173 mg/dL — AB (ref 65–99)

## 2017-09-01 LAB — HIV ANTIBODY (ROUTINE TESTING W REFLEX): HIV Screen 4th Generation wRfx: NONREACTIVE

## 2017-09-01 LAB — MAGNESIUM: Magnesium: 2.2 mg/dL (ref 1.7–2.4)

## 2017-09-01 LAB — PHOSPHORUS: PHOSPHORUS: 3.4 mg/dL (ref 2.5–4.6)

## 2017-09-01 MED ORDER — APIXABAN 5 MG PO TABS: 5.0000 mg | ORAL_TABLET | Freq: Two times a day (BID) | ORAL | 0 refills | Status: DC

## 2017-09-01 NOTE — Plan of Care (Signed)
Patient to be discharged today per MD orders.  Follow up with Dr. Saralyn Pilar on September 04, 2017--patient to make appointment on Monday.    Patient manuel for Micra Pacemaker and discharge instructions reviewed with patient.  Patient's clothes, dentures, cell phone, and cell phone charger packed and returned to him.  Patient is awaiting his ride. Will discharge via wheelchair.  Problem: Health Behavior/Discharge Planning: Goal: Ability to manage health-related needs will improve Outcome: Completed/Met   Problem: Clinical Measurements: Goal: Ability to maintain clinical measurements within normal limits will improve Outcome: Completed/Met Goal: Will remain free from infection Outcome: Completed/Met Goal: Diagnostic test results will improve Outcome: Completed/Met Goal: Cardiovascular complication will be avoided Outcome: Completed/Met   Problem: Activity: Goal: Risk for activity intolerance will decrease Outcome: Completed/Met   Problem: Nutrition: Goal: Adequate nutrition will be maintained Outcome: Completed/Met   Problem: Coping: Goal: Level of anxiety will decrease Outcome: Completed/Met   Problem: Elimination: Goal: Will not experience complications related to bowel motility Outcome: Completed/Met Goal: Will not experience complications related to urinary retention Outcome: Completed/Met   Problem: Pain Managment: Goal: General experience of comfort will improve Outcome: Completed/Met   Problem: Safety: Goal: Ability to remain free from injury will improve Outcome: Completed/Met   Problem: Skin Integrity: Goal: Risk for impaired skin integrity will decrease Outcome: Completed/Met

## 2017-09-01 NOTE — Discharge Summary (Signed)
[facility] - [place] at [facility]   PATIENT NAME: Wyatt [name]    MR#:  [id number]  DATE OF BIRTH:  13-Jun-1950  DATE OF ADMISSION:  08/30/2017 ADMITTING PHYSICIAN: Bertrum Sol, MD  DATE OF DISCHARGE: 09/01/2017 11:30 AM  PRIMARY CARE PHYSICIAN: Carolynn Serve, MD    ADMISSION DIAGNOSIS:  Syncope and collapse [R55] Ventricular tachycardia (HCC) [I47.2] Tachy-brady syndrome (HCC) [I49.5]  DISCHARGE DIAGNOSIS:  Sick sinus syndrome.  SECONDARY DIAGNOSIS:   Past Medical History:  Diagnosis Date  . Anginal pain (HCC)   . Arthritis   . Atrial fibrillation (HCC)   . Chronic kidney disease   . Diabetes mellitus without complication (HCC)   . Enlarged prostate   . GI bleed   . High cholesterol   . Hypertension   . Liver lesion   . Shortness of breath dyspnea     HOSPITAL COURSE:   1.  Sick sinus syndrome with syncope.  Wide-complex tachycardia, nonsustained polymorphic ventricular tachycardia.  Patient had a pacemaker placed by Dr. Evette Georges. 2.  Chronic atrial flutter.  Patient placed on Eliquis.  Restarted on amiodarone after pacemaker placement. 3.  Chronic kidney disease stage III 4.  BPH on Flomax and finasteride 5.  Type 2 diabetes mellitus on glipizide 6.  GERD on Protonix 7.  Severe hypokalemia on presentation.  Likely causes metolazone which was discontinued.  Recommend checking a BMP at follow-up appointment DISCHARGE CONDITIONS:   Satisfactory  CONSULTS OBTAINED:  Treatment Team:  Laurier Nancy, MD Marcina Millard, MD Annett Fabian, MD  DRUG ALLERGIES:   Allergies  Allergen Reactions  . Niacin And Related Other (See Comments)    Flushing  . Paroxetine Hcl Itching    DISCHARGE MEDICATIONS:   Allergies as of 09/01/2017      Reactions   Niacin And Related Other (See Comments)   Flushing   Paroxetine Hcl Itching      Medication List    STOP taking these medications   doxazosin 4 MG tablet Commonly known as:   CARDURA   metolazone 5 MG tablet Commonly known as:  ZAROXOLYN     TAKE these medications   allopurinol 100 MG tablet Commonly known as:  ZYLOPRIM Take 100 mg by mouth daily.   amiodarone 200 MG tablet Commonly known as:  PACERONE Take 200 mg by mouth 2 (two) times daily.   apixaban 5 MG Tabs tablet Commonly known as:  ELIQUIS Take 1 tablet (5 mg total) by mouth 2 (two) times daily.   atorvastatin 40 MG tablet Commonly known as:  LIPITOR Take 1 tablet (40 mg total) by mouth at bedtime. What changed:  when to take this   ferrous sulfate 325 (65 FE) MG tablet Take 325 mg by mouth daily.   finasteride 5 MG tablet Commonly known as:  PROSCAR Take 5 mg by mouth daily.   glipiZIDE 5 MG tablet Commonly known as:  GLUCOTROL Take 5 mg by mouth 2 (two) times daily before a meal.   hydrALAZINE 25 MG tablet Commonly known as:  APRESOLINE Take 25 mg by mouth daily.   isosorbide mononitrate 60 MG 24 hr tablet Commonly known as:  IMDUR Take 60 mg by mouth daily.   multivitamin with minerals Tabs tablet Take 1 tablet by mouth daily.   nitroGLYCERIN 0.4 MG SL tablet Commonly known as:  NITROSTAT Place 0.4 mg under the tongue every 5 (five) minutes as needed for chest pain.   pantoprazole 20 MG tablet Commonly known as:  PROTONIX Take 20-40 mg by mouth daily.   potassium chloride 10 MEQ tablet Commonly known as:  K-DUR,KLOR-CON Take 20 mEq by mouth 2 (two) times daily.   sucralfate 1 g tablet Commonly known as:  CARAFATE Take 1 g by mouth daily.   tamsulosin 0.4 MG Caps capsule Commonly known as:  FLOMAX Take 0.4 mg by mouth daily.   torsemide 20 MG tablet Commonly known as:  DEMADEX Take 20 mg by mouth daily.   vitamin C 500 MG tablet Commonly known as:  ASCORBIC ACID Take 500 mg by mouth 2 (two) times daily.        DISCHARGE INSTRUCTIONS:   Satisfactory  If you experience worsening of your admission symptoms, develop shortness of breath, life  threatening emergency, suicidal or homicidal thoughts you must seek medical attention immediately by calling 911 or calling your MD immediately  if symptoms less severe.  You Must read complete instructions/literature along with all the possible adverse reactions/side effects for all the Medicines you take and that have been prescribed to you. Take any new Medicines after you have completely understood and accept all the possible adverse reactions/side effects.   Please note  You were cared for by a hospitalist during your hospital stay. If you have any questions about your discharge medications or the care you received while you were in the hospital after you are discharged, you can call the unit and asked to speak with the hospitalist on call if the hospitalist that took care of you is not available. Once you are discharged, your primary care physician will handle any further medical issues. Please note that NO REFILLS for any discharge medications will be authorized once you are discharged, as it is imperative that you return to your primary care physician (or establish a relationship with a primary care physician if you do not have one) for your aftercare needs so that they can reassess your need for medications and monitor your lab values.    Today   CHIEF COMPLAINT:   Chief Complaint  Patient presents with  . Loss of Consciousness  . Irregular Heart Beat    HISTORY OF PRESENT ILLNESS:  Wyatt [name]  is a 67 y.o. male with a known history of presented with syncope and tachybradycardia syndrome   VITAL SIGNS:  Blood pressure (!) 124/59, pulse (!) 59, temperature 98.1 F (36.7 C), temperature source Oral, resp. rate 16, height 6\' 2"  (1.88 m), weight 111 kg (244 lb 11.4 oz), SpO2 98 %.    PHYSICAL EXAMINATION:  GENERAL:  67 y.o.-year-old patient lying in the bed with no acute distress.  EYES: Pupils equal, round, reactive to light and accommodation. No scleral icterus. Extraocular  muscles intact.  HEENT: Head atraumatic, normocephalic. Oropharynx and nasopharynx clear.  NECK:  Supple, no jugular venous distention. No thyroid enlargement, no tenderness.  LUNGS: Normal breath sounds bilaterally, no wheezing, rales,rhonchi or crepitation. No use of accessory muscles of respiration.  CARDIOVASCULAR: S1, S2 normal. No murmurs, rubs, or gallops.  ABDOMEN: Soft, non-tender, non-distended. Bowel sounds present. No organomegaly or mass.  EXTREMITIES: Trace edema, no cyanosis, or clubbing.  NEUROLOGIC: Cranial nerves II through XII are intact. Muscle strength 5/5 in all extremities. Sensation intact. Gait not checked.  PSYCHIATRIC: The patient is alert and oriented x 3.  SKIN: No obvious rash, lesion, or ulcer.   DATA REVIEW:   CBC Recent Labs  Lab 08/31/17 0348  WBC 6.8  HGB 9.5*  HCT 30.2*  PLT 183  Chemistries  Recent Labs  Lab 08/30/17 1529  09/01/17 0405  NA 136   < > 139  K 2.4*   < > 3.5  CL 92*   < > 97*  CO2 27   < > 29  GLUCOSE 413*   < > 123*  BUN 65*   < > 53*  CREATININE 2.30*   < > 2.25*  CALCIUM 10.1   < > 9.6  MG  --    < > 2.2  AST 31  --   --   ALT 16*  --   --   ALKPHOS 117  --   --   BILITOT 0.9  --   --    < > = values in this interval not displayed.    Cardiac Enzymes Recent Labs  Lab 08/31/17 0143  TROPONINI 0.05*    Microbiology Results  Results for orders placed or performed during the hospital encounter of 08/30/17  MRSA PCR Screening     Status: None   Collection Time: 08/30/17  6:15 PM  Result Value Ref Range Status   MRSA by PCR NEGATIVE NEGATIVE Final    Comment:        The GeneXpert MRSA Assay (FDA approved for NASAL specimens only), is one component of a comprehensive MRSA colonization surveillance program. It is not intended to diagnose MRSA infection nor to guide or monitor treatment for MRSA infections. Performed at [facility], [address], [place], [place] [address]         Management plans discussed with the patient, and he is in agreement.  CODE STATUS:  Code Status History    Date Active Date Inactive Code Status Order ID Comments User Context   08/30/2017 1742 09/01/2017 1504 Full Code [id number]  Bertrum Sol, MD ED   08/30/2017 1742 08/30/2017 1742 Full Code [id number]  Salary, Evelena Asa, MD ED   11/29/2015 1609 12/01/2015 1745 Full Code [id number]  Alford Highland, MD Inpatient   07/30/2015 1004 07/31/2015 1316 Full Code [id number]  Laurier Nancy, MD Inpatient   07/30/2015 1003 07/30/2015 1004 Full Code [id number]  Laurier Nancy, MD Inpatient      TOTAL TIME TAKING CARE OF THIS PATIENT: .    Alford Highland M.D on 09/01/2017 at 4:14 PM  Between 7am to 6pm - Pager - [phone]  After 6pm go to [email or web address] - password [employer]  [facility] Office  [phone]  CC: Primary care physician; Carolynn Serve, MD

## 2017-09-02 LAB — CALCIUM, IONIZED: CALCIUM, IONIZED, SERUM: 5.2 mg/dL (ref 4.5–5.6)

## 2017-09-03 ENCOUNTER — Encounter: Payer: Self-pay | Admitting: Cardiology

## 2017-09-04 ENCOUNTER — Ambulatory Visit: Admit: 2017-09-04 | Payer: BLUE CROSS/BLUE SHIELD | Admitting: Cardiology

## 2017-09-04 SURGERY — PACEMAKER LEADLESS INSERTION
Anesthesia: Moderate Sedation

## 2019-10-13 IMAGING — CT CT HEAD W/O CM
3 series · 15 of 47 positions shown, 18 images · non-contrast
Comparison: CT head dated June 24, 2013.

CLINICAL DATA: Syncope.

EXAM:
CT HEAD WITHOUT CONTRAST
TECHNIQUE: Contiguous axial images were obtained from the base of the skull
through the vertex without intravenous contrast.

[Series 2: head wo · axial · 0.45mm/px · z∈[+478,+608]mm · 9 of 32 slices shown, 12 images]
[im 3/32  brain]
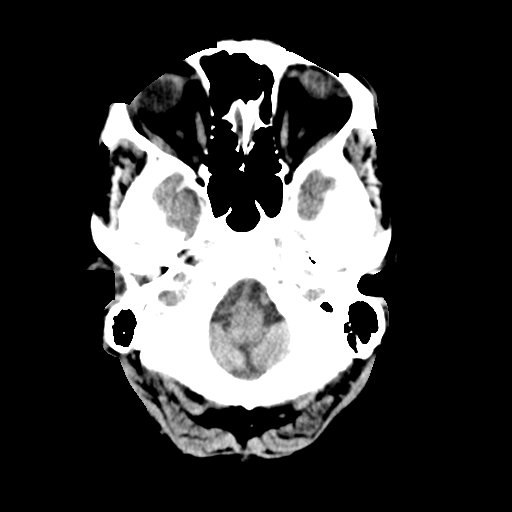
[im 3/32  bone]
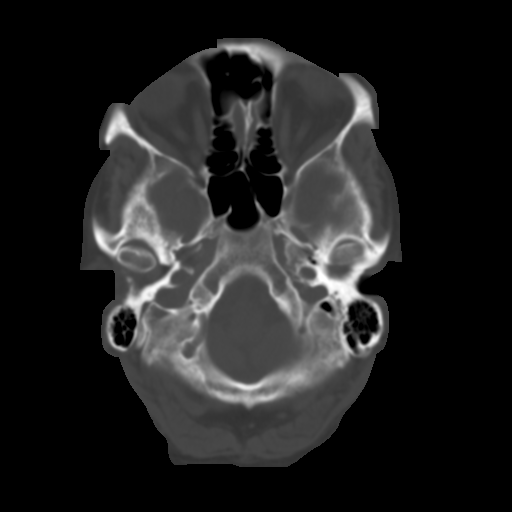
[im 6/32  brain]
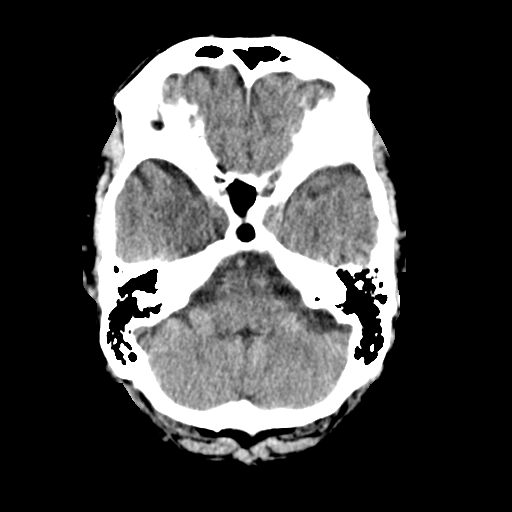
[im 9/32  brain]
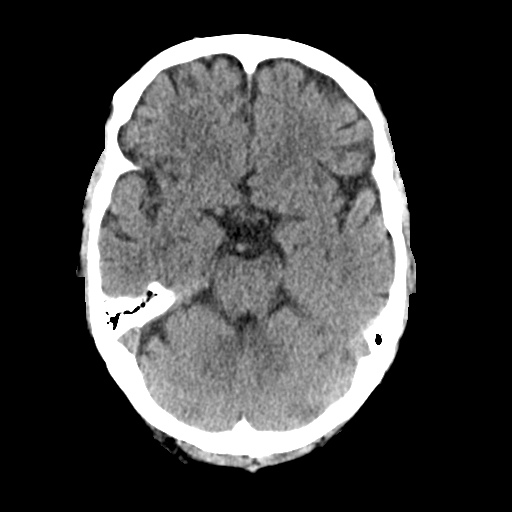
[im 12/32  brain]
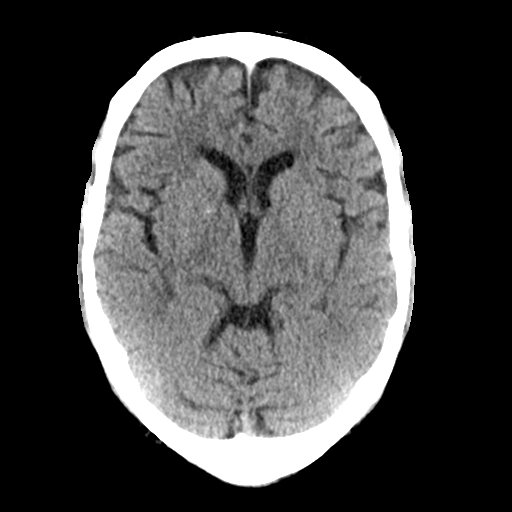
[im 17/32  brain]
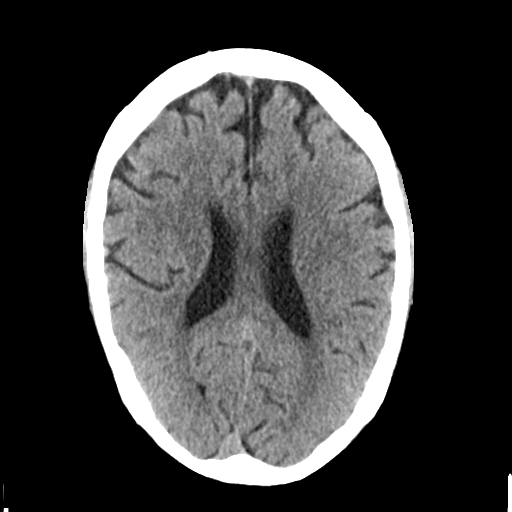
[im 17/32  bone]
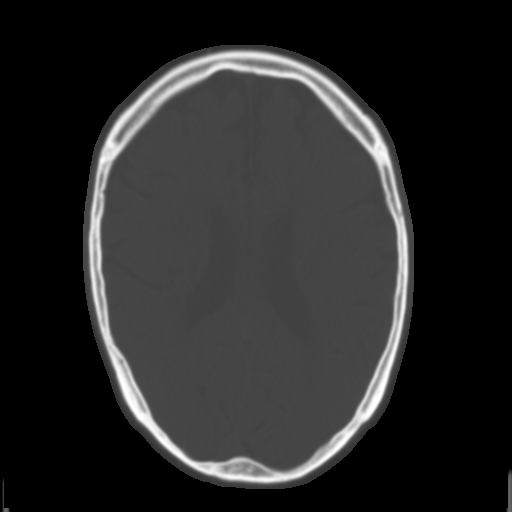
[im 20/32  brain]
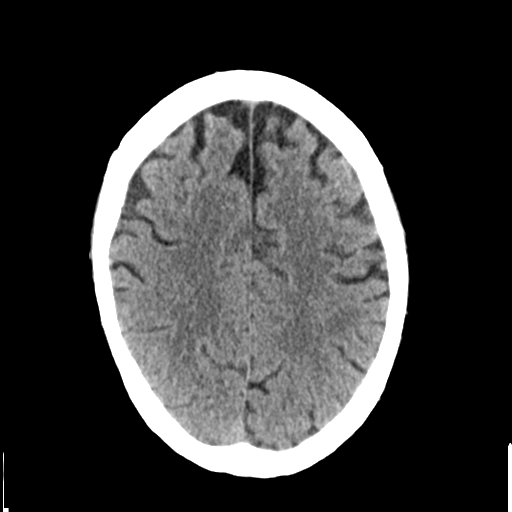
[im 23/32  brain]
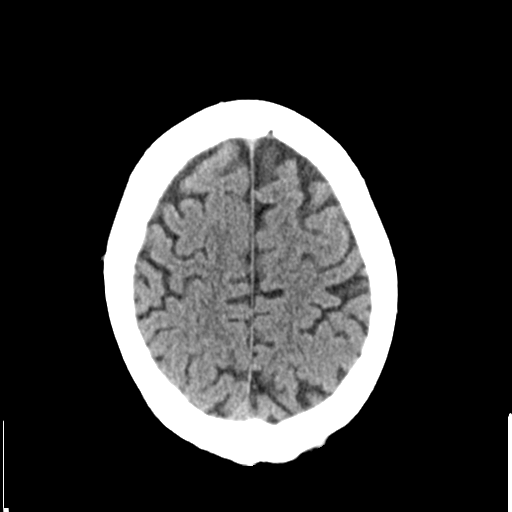
[im 26/32  brain]
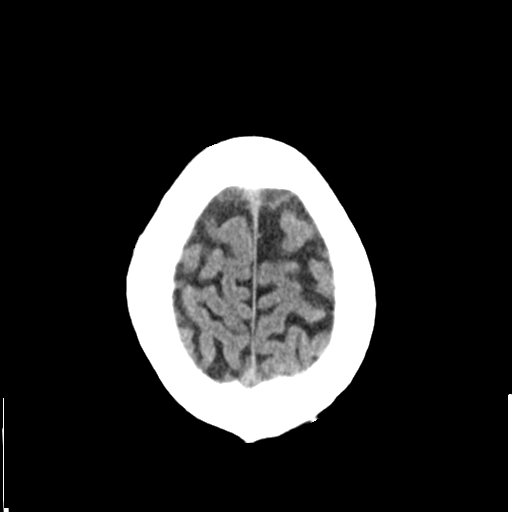
[im 29/32  brain]
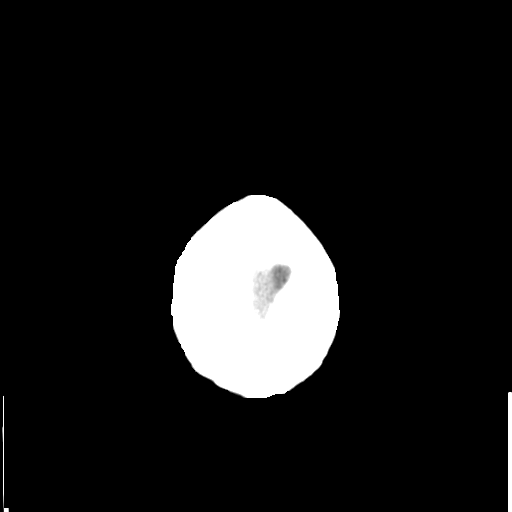
[im 29/32  bone]
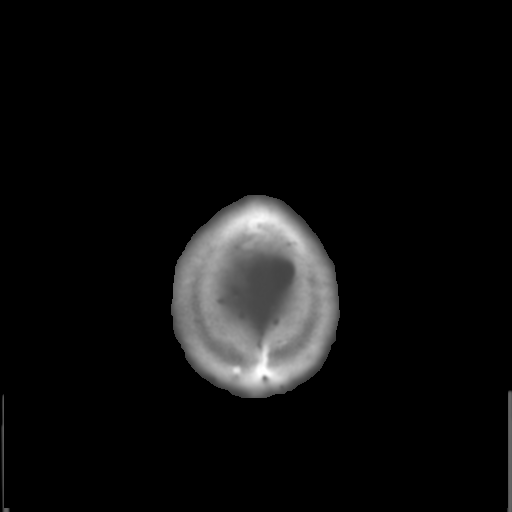

[Series 4: coronal soft tissue · coronal · 0.32mm/px · 3 of 68 slices shown]
[im 23/68  brain]
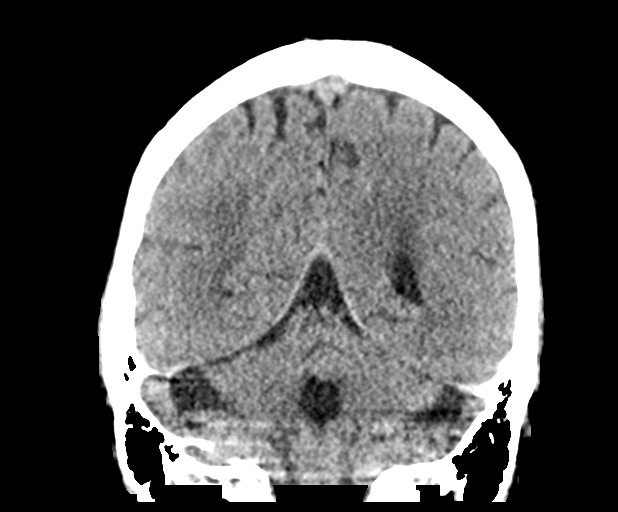
[im 30/68  brain]
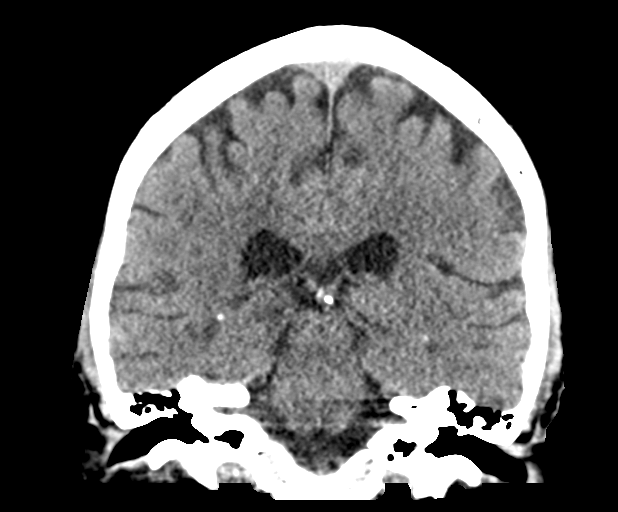
[im 38/68  brain]
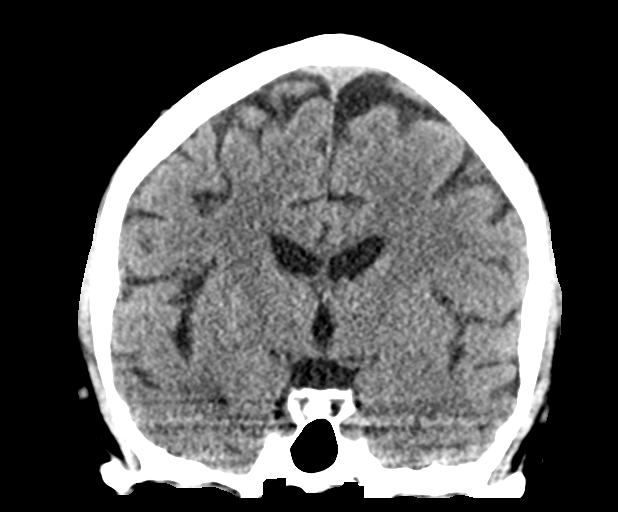

[Series 5: sagittal soft tissue · sagittal · 0.32mm/px · 3 of 53 slices shown]
[im 18/53  brain]
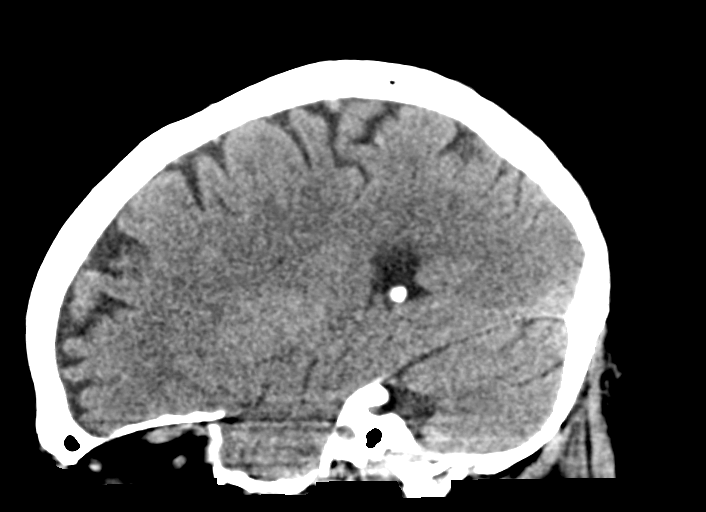
[im 27/53  brain]
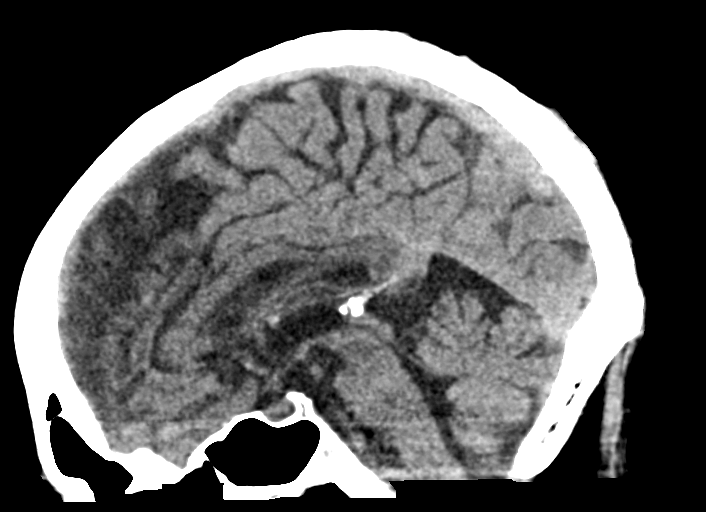
[im 35/53  brain]
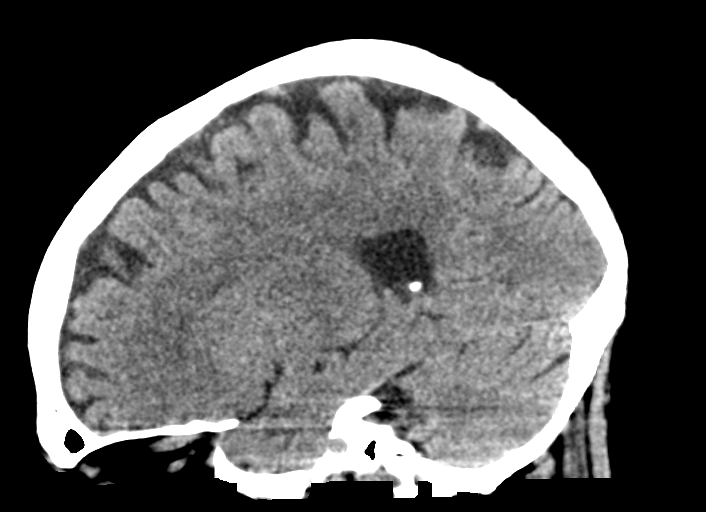

[15 of 47 positions shown; findings below may reference images not displayed]

FINDINGS: Brain: No evidence of acute infarction, hemorrhage, hydrocephalus,
extra-axial collection or mass lesion/mass effect. Mild age related
cerebral and cerebellar atrophy, unchanged.

Vascular: No hyperdense vessel or unexpected calcification.

Skull: Normal. Negative for fracture or focal lesion.

Sinuses/Orbits: No acute finding.

Other: None.
IMPRESSION: 1.  No acute intracranial abnormality.

## 2022-07-18 ENCOUNTER — Telehealth: Payer: Self-pay

## 2022-07-18 NOTE — Telephone Encounter (Signed)
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

## 2022-07-25 ENCOUNTER — Telehealth: Payer: Self-pay

## 2022-07-25 NOTE — Telephone Encounter (Signed)
Patient called again asking about his Clearance for his DOT physcial

## 2022-07-31 ENCOUNTER — Ambulatory Visit (INDEPENDENT_AMBULATORY_CARE_PROVIDER_SITE_OTHER): Payer: Medicare HMO | Admitting: Cardiovascular Disease

## 2022-07-31 ENCOUNTER — Encounter: Payer: Self-pay | Admitting: Cardiovascular Disease

## 2022-07-31 VITALS — BP 120/70 | HR 70 | Ht 74.0 in | Wt 229.8 lb

## 2022-07-31 DIAGNOSIS — I495 Sick sinus syndrome: Secondary | ICD-10-CM

## 2022-07-31 DIAGNOSIS — I240 Acute coronary thrombosis not resulting in myocardial infarction: Secondary | ICD-10-CM

## 2022-07-31 DIAGNOSIS — Z9861 Coronary angioplasty status: Secondary | ICD-10-CM

## 2022-07-31 DIAGNOSIS — I5033 Acute on chronic diastolic (congestive) heart failure: Secondary | ICD-10-CM

## 2022-07-31 DIAGNOSIS — E782 Mixed hyperlipidemia: Secondary | ICD-10-CM

## 2022-07-31 DIAGNOSIS — I4430 Unspecified atrioventricular block: Secondary | ICD-10-CM

## 2022-07-31 DIAGNOSIS — E7800 Pure hypercholesterolemia, unspecified: Secondary | ICD-10-CM

## 2022-07-31 NOTE — Progress Notes (Signed)
Cardiology Office Note   Date:  07/31/2022   ID:  Wyatt [name], DOB Oct 09, 1950, MRN [id number]  PCP:  Sheral Apley, MD  Cardiologist:  Neoma Laming, MD      History of Present Illness: Wyatt [name] is a 72 y.o. male who presents for  Chief Complaint  Patient presents with   Follow-up    Follow up    Doing well, no chest pain      Past Medical History:  Diagnosis Date   Anginal pain ([place])    Arthritis    Atrial fibrillation ([place])    Chronic kidney disease    Diabetes mellitus without complication ([place])    Enlarged prostate    GI bleed    High cholesterol    Hypertension    Liver lesion    Shortness of breath dyspnea      Past Surgical History:  Procedure Laterality Date   CARDIAC CATHETERIZATION Left 07/30/2015   Procedure: Left Heart Cath and Coronary Angiography;  Surgeon: Dionisio David, MD;  Location: [facility];  Service: Cardiovascular;  Laterality: Left;   CARDIAC CATHETERIZATION N/A 07/30/2015   Procedure: Coronary Stent Intervention;  Surgeon: Isaias Cowman, MD;  Location: [facility];  Service: Cardiovascular;  Laterality: N/A;   CARDIAC CATHETERIZATION Left 11/30/2015   Procedure: Left Heart Cath and Coronary Angiography;  Surgeon: Dionisio David, MD;  Location: [facility];  Service: Cardiovascular;  Laterality: Left;   CARDIAC CATHETERIZATION N/A 11/30/2015   Procedure: Coronary Stent Intervention;  Surgeon: Yolonda Kida, MD;  Location: [facility];  Service: Cardiovascular;  Laterality: N/A;   kidney stones     KNEE ARTHROCENTESIS Right    PACEMAKER LEADLESS INSERTION N/A 08/31/2017   Procedure: PACEMAKER LEADLESS INSERTION;  Surgeon: Isaias Cowman, MD;  Location: [facility];  Service: Cardiovascular;  Laterality: N/A;     Current Outpatient Medications  Medication Sig Dispense Refill   allopurinol (ZYLOPRIM) 100 MG tablet Take 100 mg by mouth daily.     amiodarone (PACERONE)  200 MG tablet Take 200 mg by mouth 2 (two) times daily.     apixaban (ELIQUIS) 5 MG TABS tablet Take 1 tablet (5 mg total) by mouth 2 (two) times daily. 60 tablet 0   atorvastatin (LIPITOR) 40 MG tablet Take 1 tablet (40 mg total) by mouth at bedtime. (Patient taking differently: Take 40 mg by mouth daily.) 30 tablet 0   budesonide-formoterol (SYMBICORT) 160-4.5 MCG/ACT inhaler Inhale 2 puffs into the lungs 2 (two) times daily.     ferrous sulfate 325 (65 FE) MG tablet Take 325 mg by mouth daily.     finasteride (PROSCAR) 5 MG tablet Take 5 mg by mouth daily.     glipiZIDE (GLUCOTROL) 5 MG tablet Take 5 mg by mouth 2 (two) times daily before a meal.     hydrALAZINE (APRESOLINE) 25 MG tablet Take 25 mg by mouth daily.     isosorbide mononitrate (IMDUR) 60 MG 24 hr tablet Take 60 mg by mouth daily.     nitroGLYCERIN (NITROSTAT) 0.4 MG SL tablet Place 0.4 mg under the tongue every 5 (five) minutes as needed for chest pain.     pantoprazole (PROTONIX) 20 MG tablet Take 20-40 mg by mouth daily.     potassium chloride (K-DUR,KLOR-CON) 10 MEQ tablet Take 20 mEq by mouth 2 (two) times daily.      sucralfate (CARAFATE) 1 g tablet Take 1 g by  mouth daily.     tamsulosin (FLOMAX) 0.4 MG CAPS capsule Take 0.4 mg by mouth daily.     torsemide (DEMADEX) 20 MG tablet Take 20 mg by mouth daily.     FARXIGA 10 MG TABS tablet Take 10 mg by mouth daily.     Multiple Vitamin (MULTIVITAMIN WITH MINERALS) TABS tablet Take 1 tablet by mouth daily. (Patient not taking: Reported on 07/31/2022)     vitamin C (ASCORBIC ACID) 500 MG tablet Take 500 mg by mouth 2 (two) times daily. (Patient not taking: Reported on 07/31/2022)     No current facility-administered medications for this visit.    Allergies:   Niacin and related and Paroxetine hcl    Social History:   reports that he quit smoking about 53 years ago. His smoking use included cigarettes. He has quit using smokeless tobacco. He reports current alcohol use of  about 1.0 standard drink of alcohol per week. He reports that he does not use drugs.   Family History:  family history includes CAD in his father and mother; Dementia in his mother; Parkinson's disease in his mother.    ROS:     Review of Systems  Constitutional: Negative.   HENT: Negative.    Eyes: Negative.   Respiratory: Negative.    Gastrointestinal: Negative.   Genitourinary: Negative.   Musculoskeletal: Negative.   Skin: Negative.   Neurological: Negative.   Endo/Heme/Allergies: Negative.   Psychiatric/Behavioral: Negative.    All other systems reviewed and are negative.     All other systems are reviewed and negative.    PHYSICAL EXAM: VS:  BP 120/70   Pulse 70   Ht '6\' 2"'$  (1.88 m)   Wt 229 lb 12.8 oz (104.2 kg)   SpO2 97%   BMI 29.50 kg/m  , BMI Body mass index is 29.5 kg/m. Last weight:  Wt Readings from Last 3 Encounters:  07/31/22 229 lb 12.8 oz (104.2 kg)  09/01/17 244 lb 11.4 oz (111 kg)  11/29/15 282 lb 8 oz (128.1 kg)     Physical Exam Vitals reviewed.  Constitutional:      Appearance: Normal appearance. He is normal weight.  HENT:     Head: Normocephalic.     Nose: Nose normal.     Mouth/Throat:     Mouth: Mucous membranes are moist.  Eyes:     Pupils: Pupils are equal, round, and reactive to light.  Cardiovascular:     Rate and Rhythm: Normal rate and regular rhythm.     Pulses: Normal pulses.     Heart sounds: Normal heart sounds.  Pulmonary:     Effort: Pulmonary effort is normal.  Abdominal:     General: Abdomen is flat. Bowel sounds are normal.  Musculoskeletal:        General: Normal range of motion.     Cervical back: Normal range of motion.  Skin:    General: Skin is warm.  Neurological:     General: No focal deficit present.     Mental Status: He is alert.  Psychiatric:        Mood and Affect: Mood normal.       EKG:   Recent Labs: No results found for requested labs within last 365 days.    Lipid Panel     Component Value Date/Time   CHOL 85 08/31/2017 0348   TRIG 83 08/31/2017 0348   HDL 26 (L) 08/31/2017 0348   CHOLHDL 3.3 08/31/2017 0348   VLDL 17  08/31/2017 0348   LDLCALC 42 08/31/2017 [place] [address] [place], [place] [address] [phone] STUDY:  Gated Stress / Rest Myocardial Perfusion Imaging Tomographic (SPECT) Including attenuation correction Wall Motion, Left Ventricular Ejection Fraction By Gated Technique.Persantine Stress Test. SEX: Male     WEIGHT: 252 lbs    HEIGHT: 74 in                ARMS UP: YES/NO                                                                                                                                                                                REFERRING PHYSICIAN: Dr.Milledge Gerding Humphrey Rolls                                                                                                                                                                                                                       INDICATION FOR STUDY: CP  TECHNIQUE:  Approximately 20 minutes following the intravenous administration of 10.5 mCi of Tc-47mSestamibi after stress testing in a reclined supine position with arms above their head if able to do so, gated SPECT imaging of the heart was performed. After about a 2hr break, the patient was injected intravenously with 32.0 mCi of Tc-956mestamibi.  Approximately 45 minutes later in the same position as stress imaging SPECT rest imaging of the heart was performed.  STRESS BY:  ShNeoma LamingMD PROTOCOL:  Persantine  DOSE ADMINISTERED: 10 CC   ROUTE OF ADMINISTRATION: IV                                                                            MAX PRED HR: 153                      85%: 130               75%: 115                                                                                                                   RESTING BP: 134/70   RESTING HR: 60  PEAK BP: 130/66   PEAK HR: 64                                                                    EXERCISE DURATION:    4 min injection                                            REASON FOR TEST TERMINATION:    Protocol end                                                                                                                              SYMPTOMS:  None                                                                                                                                                                                                          EKG RESULTS:  Ventricular paced rhythm 61/min. No significant ST changes with persantine.                                                            IMAGE QUALITY: Fair                                                                                                                                                                                                                                                                                                                                 PERFUSION/WALL MOTION FINDINGS:  EF = 64%. Dilated LV. Small mild fixed apical (17), moderate size and intensity reversible apical anterior, apical septal, apical inferior and apical lateral wall defects, normal wall motion.                                                                         IMPRESSION: Possible ischemia in the LCX territory with normal LVEF. Dilated LV.                                                                                                                                                                                                                                                                                        Neoma Laming, MD Stress Interpreting  Physician / Nuclear Interpreting Physician                Neoma Laming MD  Electronically signed by: Neoma Laming     Date: 08/06/2020 13:04 REASON FOR VISIT  Visit for: Echocardiogram/Precordial Pain  Sex:   Male          wt= 255   lbs.  BP= 132/78  Height= 74   inches.        TESTS  Imaging: Echocardiogram:  An echocardiogram in (2-d) mode was performed and in Doppler mode with color flow velocity mapping was performed. The aortic valve cusps are abnormal 2.4   cm, flow velocity 1.26   m/s, and systolic calculated mean flow gradient 4  mmHg. Mitral valve diastolic peak flow velocity E 1.25    m/s. Aortic root diameter 3.9    cm. The LVOT internal diameter 3.0  cm and flow velocity was abnormal 99991111   m/s. LV systolic dimension [id number]   cm, diastolic 123456  cm, posterior wall thickness 1.36   cm, fractional shortening 35.1  %, and EF 64.1  %. IVS thickness 1.66   cm. LA dimension 5.8 cm. Mitral  Valve has Mild Regurgitation.     ASSESSMENT  Technically difficult study due to body habitus.  Normal left ventricular systolic function.  Mild left ventricular hypertrophy with GRADE 3 (restrictive physiology) diastolic dysfunction.  Normal right ventricular systolic function.  Normal right ventricular diastolic function.  Normal left ventricular wall motion.  Normal right ventricular wall motion.  Normal pulmonary artery pressure.  Mild mitral regurgitation.  No pericardial effusion.  Severely dilated Left atrium  Normal Left ventricle  Normal Right atrium and ventricle  Normal Ao root and ascending Ao  Moderate LVH.     THERAPY   Referring physician: Dionisio David  Sonographer: Neoma Laming.      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    ICD-10-CM   1. Coronary angioplasty status  Z98.61 PCV ECHOCARDIOGRAM COMPLETE    MYOCARDIAL PERFUSION IMAGING    2. 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 Acute coronary artery obstruction without MI ([facility])  I24.0 PCV ECHOCARDIOGRAM COMPLETE    MYOCARDIAL PERFUSION IMAGING    4. Pure hypercholesterolemia  E78.00 PCV ECHOCARDIOGRAM COMPLETE    MYOCARDIAL PERFUSION IMAGING    5. Mixed hyperlipidemia  E78.2 PCV ECHOCARDIOGRAM COMPLETE    MYOCARDIAL PERFUSION IMAGING    6. AV block  I44.30 PCV ECHOCARDIOGRAM COMPLETE    MYOCARDIAL PERFUSION IMAGING    7. CHF (congestive heart failure), NYHA class III, acute on chronic, diastolic ([facility])  [id number] PCV ECHOCARDIOGRAM COMPLETE    MYOCARDIAL PERFUSION IMAGING   been stable       Problem List Items Addressed This Visit       Cardiovascular and Mediastinum   Acute coronary artery obstruction without MI ([place])   Relevant Orders   PCV ECHOCARDIOGRAM COMPLETE   MYOCARDIAL PERFUSION IMAGING   Tachy-brady syndrome ([place])   Relevant Orders   PCV ECHOCARDIOGRAM COMPLETE   MYOCARDIAL PERFUSION IMAGING     Other   Coronary angioplasty status - Primary   Relevant Orders   PCV ECHOCARDIOGRAM COMPLETE   MYOCARDIAL PERFUSION IMAGING   Other Visit Diagnoses     Pure hypercholesterolemia       Relevant Orders   PCV ECHOCARDIOGRAM COMPLETE   MYOCARDIAL PERFUSION IMAGING   Mixed hyperlipidemia       Relevant Orders   PCV ECHOCARDIOGRAM COMPLETE   MYOCARDIAL PERFUSION IMAGING   AV block       Relevant Orders   PCV ECHOCARDIOGRAM COMPLETE   MYOCARDIAL PERFUSION IMAGING   CHF (congestive heart failure), NYHA class III, acute on chronic, diastolic (HCC)       been stable   Relevant Orders   PCV ECHOCARDIOGRAM COMPLETE   MYOCARDIAL PERFUSION IMAGING          Disposition:   Return in about 2  months (around 09/29/2022) for after echo and stress test.    Total time spent: 30 minutes  Signed,  Neoma Laming, MD  07/31/2022 11:15 [place]

## 2022-07-31 NOTE — Patient Instructions (Signed)
Cleared for DOT , as has been doing well.

## 2022-08-15 ENCOUNTER — Other Ambulatory Visit: Payer: Self-pay | Admitting: Cardiovascular Disease

## 2022-08-15 DIAGNOSIS — I10 Essential (primary) hypertension: Secondary | ICD-10-CM

## 2022-08-30 ENCOUNTER — Other Ambulatory Visit: Payer: Self-pay | Admitting: Cardiology

## 2022-09-04 ENCOUNTER — Other Ambulatory Visit: Payer: Self-pay

## 2022-09-07 ENCOUNTER — Other Ambulatory Visit: Payer: Self-pay | Admitting: Cardiovascular Disease

## 2022-09-07 ENCOUNTER — Ambulatory Visit (INDEPENDENT_AMBULATORY_CARE_PROVIDER_SITE_OTHER): Payer: Medicare HMO

## 2022-09-07 DIAGNOSIS — I4430 Unspecified atrioventricular block: Secondary | ICD-10-CM

## 2022-09-07 DIAGNOSIS — E7800 Pure hypercholesterolemia, unspecified: Secondary | ICD-10-CM

## 2022-09-07 DIAGNOSIS — I240 Acute coronary thrombosis not resulting in myocardial infarction: Secondary | ICD-10-CM

## 2022-09-07 DIAGNOSIS — Z9861 Coronary angioplasty status: Secondary | ICD-10-CM

## 2022-09-07 DIAGNOSIS — I495 Sick sinus syndrome: Secondary | ICD-10-CM | POA: Diagnosis not present

## 2022-09-07 DIAGNOSIS — I4892 Unspecified atrial flutter: Secondary | ICD-10-CM

## 2022-09-07 DIAGNOSIS — E782 Mixed hyperlipidemia: Secondary | ICD-10-CM

## 2022-09-07 DIAGNOSIS — I5033 Acute on chronic diastolic (congestive) heart failure: Secondary | ICD-10-CM

## 2022-09-07 MED ORDER — TECHNETIUM TC 99M SESTAMIBI GENERIC - CARDIOLITE
32.3000 | Freq: Once | INTRAVENOUS | Status: AC | PRN
Start: 2022-09-07 — End: 2022-09-07
  Administered 2022-09-07: 32.3 via INTRAVENOUS

## 2022-09-07 MED ORDER — TECHNETIUM TC 99M SESTAMIBI GENERIC - CARDIOLITE
10.5000 | Freq: Once | INTRAVENOUS | Status: AC | PRN
Start: 2022-09-07 — End: 2022-09-07
  Administered 2022-09-07: 10.5 via INTRAVENOUS

## 2022-09-12 ENCOUNTER — Other Ambulatory Visit: Payer: Self-pay | Admitting: Cardiovascular Disease

## 2022-09-12 DIAGNOSIS — I4892 Unspecified atrial flutter: Secondary | ICD-10-CM

## 2022-09-13 ENCOUNTER — Other Ambulatory Visit: Payer: Self-pay

## 2022-09-13 DIAGNOSIS — I4892 Unspecified atrial flutter: Secondary | ICD-10-CM

## 2022-09-13 MED ORDER — APIXABAN 2.5 MG PO TABS
2.5000 mg | ORAL_TABLET | Freq: Two times a day (BID) | ORAL | 0 refills | Status: DC
Start: 2022-09-13 — End: 2022-12-06

## 2022-09-14 ENCOUNTER — Ambulatory Visit: Payer: Medicare HMO | Admitting: Cardiovascular Disease

## 2022-09-19 ENCOUNTER — Ambulatory Visit (INDEPENDENT_AMBULATORY_CARE_PROVIDER_SITE_OTHER): Payer: Medicare HMO

## 2022-09-19 DIAGNOSIS — I495 Sick sinus syndrome: Secondary | ICD-10-CM

## 2022-09-19 DIAGNOSIS — I361 Nonrheumatic tricuspid (valve) insufficiency: Secondary | ICD-10-CM

## 2022-09-19 DIAGNOSIS — E7800 Pure hypercholesterolemia, unspecified: Secondary | ICD-10-CM

## 2022-09-19 DIAGNOSIS — I340 Nonrheumatic mitral (valve) insufficiency: Secondary | ICD-10-CM

## 2022-09-19 DIAGNOSIS — I240 Acute coronary thrombosis not resulting in myocardial infarction: Secondary | ICD-10-CM

## 2022-09-19 DIAGNOSIS — E782 Mixed hyperlipidemia: Secondary | ICD-10-CM

## 2022-09-19 DIAGNOSIS — I4430 Unspecified atrioventricular block: Secondary | ICD-10-CM

## 2022-09-19 DIAGNOSIS — Z9861 Coronary angioplasty status: Secondary | ICD-10-CM

## 2022-09-19 DIAGNOSIS — I5033 Acute on chronic diastolic (congestive) heart failure: Secondary | ICD-10-CM

## 2022-09-25 ENCOUNTER — Encounter: Payer: Self-pay | Admitting: Cardiovascular Disease

## 2022-09-25 ENCOUNTER — Ambulatory Visit (INDEPENDENT_AMBULATORY_CARE_PROVIDER_SITE_OTHER): Payer: Medicare HMO | Admitting: Cardiovascular Disease

## 2022-09-25 VITALS — BP 120/70 | HR 67 | Ht 74.0 in | Wt 226.6 lb

## 2022-09-25 DIAGNOSIS — I495 Sick sinus syndrome: Secondary | ICD-10-CM | POA: Diagnosis not present

## 2022-09-25 DIAGNOSIS — I240 Acute coronary thrombosis not resulting in myocardial infarction: Secondary | ICD-10-CM

## 2022-09-25 DIAGNOSIS — I2510 Atherosclerotic heart disease of native coronary artery without angina pectoris: Secondary | ICD-10-CM

## 2022-09-25 DIAGNOSIS — E782 Mixed hyperlipidemia: Secondary | ICD-10-CM

## 2022-09-25 DIAGNOSIS — I5033 Acute on chronic diastolic (congestive) heart failure: Secondary | ICD-10-CM | POA: Diagnosis not present

## 2022-09-25 NOTE — Progress Notes (Signed)
Cardiology Office Note   Date:  09/25/2022   ID:  [name], DOB 1950/09/26, MRN [id number]  PCP:  Carolynn Serve, MD  Cardiologist:  Adrian Blackwater, MD      History of Present Illness: Wyatt [name] is a 72 y.o. male who presents for  Chief Complaint  Patient presents with   Follow-up    2 month follow up    Infrequent chest pain      Past Medical History:  Diagnosis Date   Anginal pain    Arthritis    Atrial fibrillation    Chronic kidney disease    Diabetes mellitus without complication    Enlarged prostate    GI bleed    High cholesterol    Hypertension    Liver lesion    Shortness of breath dyspnea      Past Surgical History:  Procedure Laterality Date   CARDIAC CATHETERIZATION Left 07/30/2015   Procedure: Left Heart Cath and Coronary Angiography;  Surgeon: Laurier Nancy, MD;  Location: [facility];  Service: Cardiovascular;  Laterality: Left;   CARDIAC CATHETERIZATION N/A 07/30/2015   Procedure: Coronary Stent Intervention;  Surgeon: Marcina Millard, MD;  Location: [facility];  Service: Cardiovascular;  Laterality: N/A;   CARDIAC CATHETERIZATION Left 11/30/2015   Procedure: Left Heart Cath and Coronary Angiography;  Surgeon: Laurier Nancy, MD;  Location: [facility];  Service: Cardiovascular;  Laterality: Left;   CARDIAC CATHETERIZATION N/A 11/30/2015   Procedure: Coronary Stent Intervention;  Surgeon: Alwyn Pea, MD;  Location: [facility];  Service: Cardiovascular;  Laterality: N/A;   kidney stones     KNEE ARTHROCENTESIS Right    PACEMAKER LEADLESS INSERTION N/A 08/31/2017   Procedure: PACEMAKER LEADLESS INSERTION;  Surgeon: Marcina Millard, MD;  Location: [facility];  Service: Cardiovascular;  Laterality: N/A;     Current Outpatient Medications  Medication Sig Dispense Refill   allopurinol (ZYLOPRIM) 100 MG tablet Take 100 mg by mouth daily.     apixaban (ELIQUIS) 2.5 MG TABS tablet  Take 1 tablet (2.5 mg total) by mouth 2 (two) times daily. 180 tablet 0   atorvastatin (LIPITOR) 40 MG tablet Take 1 tablet (40 mg total) by mouth at bedtime. (Patient taking differently: Take 40 mg by mouth daily.) 30 tablet 0   budesonide-formoterol (SYMBICORT) 160-4.5 MCG/ACT inhaler Inhale 2 puffs into the lungs 2 (two) times daily.     FARXIGA 10 MG TABS tablet Take 10 mg by mouth daily.     ferrous sulfate 325 (65 FE) MG tablet Take 325 mg by mouth daily.     finasteride (PROSCAR) 5 MG tablet Take 5 mg by mouth daily.     glipiZIDE (GLUCOTROL) 5 MG tablet Take 5 mg by mouth 2 (two) times daily before a meal.     hydrALAZINE (APRESOLINE) 100 MG tablet TAKE 1 TABLET BY MOUTH TWICE DAILY 180 tablet 0   isosorbide mononitrate (IMDUR) 60 MG 24 hr tablet Take 60 mg by mouth daily.     nitroGLYCERIN (NITROSTAT) 0.4 MG SL tablet Place 0.4 mg under the tongue every 5 (five) minutes as needed for chest pain.     pantoprazole (PROTONIX) 20 MG tablet Take 20-40 mg by mouth daily.     potassium chloride (K-DUR,KLOR-CON) 10 MEQ tablet Take 20 mEq by mouth 2 (two) times daily.      sucralfate (CARAFATE) 1 g tablet Take 1 g by mouth daily.  tamsulosin (FLOMAX) 0.4 MG CAPS capsule Take 0.4 mg by mouth daily.     torsemide (DEMADEX) 20 MG tablet Take 20 mg by mouth daily.     Multiple Vitamin (MULTIVITAMIN WITH MINERALS) TABS tablet Take 1 tablet by mouth daily. (Patient not taking: Reported on 07/31/2022)     vitamin C (ASCORBIC ACID) 500 MG tablet Take 500 mg by mouth 2 (two) times daily. (Patient not taking: Reported on 07/31/2022)     No current facility-administered medications for this visit.    Allergies:   Niacin and related and Paroxetine hcl    Social History:   reports that he quit smoking about 53 years ago. His smoking use included cigarettes. He has quit using smokeless tobacco. He reports current alcohol use of about 1.0 standard drink of alcohol per week. He reports that he does not use  drugs.   Family History:  family history includes CAD in his father and mother; Dementia in his mother; Parkinson's disease in his mother.    ROS:     Review of Systems  Constitutional: Negative.   HENT: Negative.    Eyes: Negative.   Respiratory: Negative.    Gastrointestinal: Negative.   Genitourinary: Negative.   Musculoskeletal: Negative.   Skin: Negative.   Neurological: Negative.   Endo/Heme/Allergies: Negative.   Psychiatric/Behavioral: Negative.    All other systems reviewed and are negative.     All other systems are reviewed and negative.    PHYSICAL EXAM: VS:  BP 120/70   Pulse 67   Ht 6\' 2"  (1.88 m)   Wt 226 lb 9.6 oz (102.8 kg)   SpO2 97%   BMI 29.09 kg/m  , BMI Body mass index is 29.09 kg/m. Last weight:  Wt Readings from Last 3 Encounters:  09/25/22 226 lb 9.6 oz (102.8 kg)  07/31/22 229 lb 12.8 oz (104.2 kg)  09/01/17 244 lb 11.4 oz (111 kg)     Physical Exam Vitals reviewed.  Constitutional:      Appearance: Normal appearance. He is normal weight.  HENT:     Head: Normocephalic.     Nose: Nose normal.     Mouth/Throat:     Mouth: Mucous membranes are moist.  Eyes:     Pupils: Pupils are equal, round, and reactive to light.  Cardiovascular:     Rate and Rhythm: Normal rate and regular rhythm.     Pulses: Normal pulses.     Heart sounds: Normal heart sounds.  Pulmonary:     Effort: Pulmonary effort is normal.  Abdominal:     General: Abdomen is flat. Bowel sounds are normal.  Musculoskeletal:        General: Normal range of motion.     Cervical back: Normal range of motion.  Skin:    General: Skin is warm.  Neurological:     General: No focal deficit present.     Mental Status: He is alert.  Psychiatric:        Mood and Affect: Mood normal.       EKG:   Recent Labs: No results found for requested labs within last 365 days.    Lipid Panel    Component Value Date/Time   CHOL 85 08/31/2017 0348   TRIG 83 08/31/2017 0348    HDL 26 (L) 08/31/2017 0348   CHOLHDL 3.3 08/31/2017 0348   VLDL 17 08/31/2017 0348   LDLCALC 42 08/31/2017 0348      Other studies Reviewed: Additional studies/ records that were  reviewed today include:  Review of the above records demonstrates:       No data to display            ASSESSMENT AND PLAN:    ICD-10-CM   1. Acute coronary artery obstruction without MI  I24.0    stress test has septal wall reversible with normal EF, no chest pain. Has creatinine 2.3, thus cannot do CCTA/Cath until has symptoms    2. Tachy-brady syndrome  I49.5     3. Mixed hyperlipidemia  E78.2     4. CHF (congestive heart failure), NYHA class III, acute on chronic, diastolic  I50.33     5. Coronary artery disease involving native coronary artery of native heart without angina pectoris  I25.10        Problem List Items Addressed This Visit       Cardiovascular and Mediastinum   Acute coronary artery obstruction without MI - Primary   Tachy-brady syndrome   Other Visit Diagnoses     Mixed hyperlipidemia       CHF (congestive heart failure), NYHA class III, acute on chronic, diastolic       Coronary artery disease involving native coronary artery of native heart without angina pectoris              Disposition:   Return in about 3 months (around 12/25/2022).    Total time spent: 30 minutes  Signed,  Adrian Blackwater, MD  09/25/2022 1:13 PM    [facility]

## 2022-10-07 ENCOUNTER — Other Ambulatory Visit: Payer: Self-pay | Admitting: Cardiovascular Disease

## 2022-11-22 ENCOUNTER — Other Ambulatory Visit: Payer: Self-pay | Admitting: Cardiovascular Disease

## 2022-11-22 DIAGNOSIS — I10 Essential (primary) hypertension: Secondary | ICD-10-CM

## 2022-12-06 ENCOUNTER — Other Ambulatory Visit: Payer: Self-pay | Admitting: Cardiovascular Disease

## 2022-12-06 DIAGNOSIS — I4892 Unspecified atrial flutter: Secondary | ICD-10-CM

## 2022-12-25 ENCOUNTER — Ambulatory Visit (INDEPENDENT_AMBULATORY_CARE_PROVIDER_SITE_OTHER): Payer: Medicare HMO | Admitting: Cardiology

## 2022-12-25 ENCOUNTER — Encounter: Payer: Self-pay | Admitting: Cardiology

## 2022-12-25 VITALS — BP 130/70 | HR 64 | Ht 74.0 in | Wt 227.4 lb

## 2022-12-25 DIAGNOSIS — I2510 Atherosclerotic heart disease of native coronary artery without angina pectoris: Secondary | ICD-10-CM | POA: Diagnosis not present

## 2022-12-25 DIAGNOSIS — I482 Chronic atrial fibrillation, unspecified: Secondary | ICD-10-CM

## 2022-12-25 DIAGNOSIS — E782 Mixed hyperlipidemia: Secondary | ICD-10-CM | POA: Diagnosis not present

## 2022-12-25 DIAGNOSIS — I5033 Acute on chronic diastolic (congestive) heart failure: Secondary | ICD-10-CM | POA: Diagnosis not present

## 2022-12-25 DIAGNOSIS — I495 Sick sinus syndrome: Secondary | ICD-10-CM | POA: Diagnosis not present

## 2022-12-25 DIAGNOSIS — I10 Essential (primary) hypertension: Secondary | ICD-10-CM

## 2022-12-25 NOTE — Assessment & Plan Note (Signed)
In sinus rhythm on auscultation. Continue Eliquis.

## 2022-12-25 NOTE — Assessment & Plan Note (Signed)
Continue atorvastatin 40 mg daily.  

## 2022-12-25 NOTE — Progress Notes (Signed)
Cardiology Office Note   Date:  12/25/2022   ID:  RICHEY DOOLITTLE, DOB [birth year], MRN [id number]  PCP:  Carolynn Serve, MD  Cardiologist:  Marisue Ivan, NP      History of Present Illness: Wyatt [name] is a 72 y.o. male who presents for  Chief Complaint  Patient presents with   Follow-up    3 mo F/U    Patient in office for routine cardiac exam. Denies chest pain, shortness of breath, dizziness, lower extremity edema. Patient reports shortness of breath with prolonged exertion, walking up a hill. Relieved with drinking cold water.      Past Medical History:  Diagnosis Date   Anginal pain (HCC)    Arthritis    Atrial fibrillation (HCC)    Chronic kidney disease    Diabetes mellitus without complication (HCC)    Enlarged prostate    GI bleed    High cholesterol    Hypertension    Liver lesion    Shortness of breath dyspnea      Past Surgical History:  Procedure Laterality Date   CARDIAC CATHETERIZATION Left 07/30/2015   Procedure: Left Heart Cath and Coronary Angiography;  Surgeon: Laurier Nancy, MD;  Location: [facility];  Service: Cardiovascular;  Laterality: Left;   CARDIAC CATHETERIZATION N/A 07/30/2015   Procedure: Coronary Stent Intervention;  Surgeon: Marcina Millard, MD;  Location: [facility];  Service: Cardiovascular;  Laterality: N/A;   CARDIAC CATHETERIZATION Left 11/30/2015   Procedure: Left Heart Cath and Coronary Angiography;  Surgeon: Laurier Nancy, MD;  Location: [facility];  Service: Cardiovascular;  Laterality: Left;   CARDIAC CATHETERIZATION N/A 11/30/2015   Procedure: Coronary Stent Intervention;  Surgeon: Alwyn Pea, MD;  Location: [facility];  Service: Cardiovascular;  Laterality: N/A;   kidney stones     KNEE ARTHROCENTESIS Right    PACEMAKER LEADLESS INSERTION N/A 08/31/2017   Procedure: PACEMAKER LEADLESS INSERTION;  Surgeon: Marcina Millard, MD;  Location: [facility];   Service: Cardiovascular;  Laterality: N/A;     Current Outpatient Medications  Medication Sig Dispense Refill   allopurinol (ZYLOPRIM) 100 MG tablet Take 100 mg by mouth daily.     atorvastatin (LIPITOR) 40 MG tablet Take 1 tablet (40 mg total) by mouth daily. 30 tablet 2   budesonide-formoterol (SYMBICORT) 160-4.5 MCG/ACT inhaler Inhale 2 puffs into the lungs 2 (two) times daily.     ELIQUIS 2.5 MG TABS tablet TAKE 1 TABLET(2.5 MG) BY MOUTH TWICE DAILY 180 tablet 0   FARXIGA 10 MG TABS tablet Take 10 mg by mouth daily.     ferrous sulfate 325 (65 FE) MG tablet Take 325 mg by mouth daily.     finasteride (PROSCAR) 5 MG tablet Take 5 mg by mouth daily.     glipiZIDE (GLUCOTROL) 5 MG tablet Take 5 mg by mouth 2 (two) times daily before a meal.     hydrALAZINE (APRESOLINE) 100 MG tablet TAKE 1 TABLET BY MOUTH TWICE DAILY 180 tablet 0   isosorbide mononitrate (IMDUR) 60 MG 24 hr tablet Take 60 mg by mouth daily.     losartan (COZAAR) 50 MG tablet TAKE 1 TABLET BY MOUTH EVERY DAY 90 tablet 0   nitroGLYCERIN (NITROSTAT) 0.4 MG SL tablet Place 0.4 mg under the tongue every 5 (five) minutes as needed for chest pain.     pantoprazole (PROTONIX) 20 MG tablet Take 20-40 mg by mouth daily.  potassium chloride (K-DUR,KLOR-CON) 10 MEQ tablet Take 20 mEq by mouth 2 (two) times daily.      sucralfate (CARAFATE) 1 g tablet Take 1 g by mouth daily.     tamsulosin (FLOMAX) 0.4 MG CAPS capsule Take 0.4 mg by mouth daily.     tirzepatide [facility]) 5 MG/0.5ML Pen Inject 5 mg into the skin once a week.     torsemide (DEMADEX) 20 MG tablet Take 20 mg by mouth daily.     No current facility-administered medications for this visit.    Allergies:   Niacin and related and Paroxetine hcl    Social History:   reports that he quit smoking about 53 years ago. His smoking use included cigarettes. He has quit using smokeless tobacco. He reports current alcohol use of about 1.0 standard drink of alcohol per week. He  reports that he does not use drugs.   Family History:  family history includes CAD in his father and mother; Dementia in his mother; Parkinson's disease in his mother.    ROS:     Review of Systems  Constitutional: Negative.   HENT: Negative.    Eyes: Negative.   Respiratory: Negative.    Cardiovascular: Negative.   Gastrointestinal: Negative.   Genitourinary: Negative.   Musculoskeletal: Negative.   Skin: Negative.   Neurological: Negative.   Endo/Heme/Allergies: Negative.   Psychiatric/Behavioral: Negative.    All other systems reviewed and are negative.    All other systems are reviewed and negative.    PHYSICAL EXAM: VS:  BP 130/70   Pulse 64   Ht 6\' 2"  (1.88 m)   Wt 227 lb 6.4 oz (103.1 kg)   SpO2 97%   BMI 29.20 kg/m  , BMI Body mass index is 29.2 kg/m. Last weight:  Wt Readings from Last 3 Encounters:  12/25/22 227 lb 6.4 oz (103.1 kg)  09/25/22 226 lb 9.6 oz (102.8 kg)  07/31/22 229 lb 12.8 oz (104.2 kg)     Physical Exam Vitals and nursing note reviewed.  Constitutional:      Appearance: Normal appearance. He is normal weight.  HENT:     Head: Normocephalic and atraumatic.     Nose: Nose normal.     Mouth/Throat:     Mouth: Mucous membranes are moist.     Pharynx: Oropharynx is clear.  Eyes:     Extraocular Movements: Extraocular movements intact.     Conjunctiva/sclera: Conjunctivae normal.     Pupils: Pupils are equal, round, and reactive to light.  Cardiovascular:     Rate and Rhythm: Normal rate and regular rhythm.     Pulses: Normal pulses.     Heart sounds: Normal heart sounds.  Pulmonary:     Effort: Pulmonary effort is normal.     Breath sounds: Normal breath sounds.  Abdominal:     General: Abdomen is flat. Bowel sounds are normal.     Palpations: Abdomen is soft.  Musculoskeletal:        General: Normal range of motion.     Cervical back: Normal range of motion.  Skin:    General: Skin is warm and dry.  Neurological:      General: No focal deficit present.     Mental Status: He is alert and oriented to person, place, and time. Mental status is at baseline.  Psychiatric:        Mood and Affect: Mood normal.        Behavior: Behavior normal.  Thought Content: Thought content normal.        Judgment: Judgment normal.      EKG: none today  Recent Labs: No results found for requested labs within last 365 days.    Lipid Panel    Component Value Date/Time   CHOL 85 08/31/2017 0348   TRIG 83 08/31/2017 0348   HDL 26 (L) 08/31/2017 0348   CHOLHDL 3.3 08/31/2017 0348   VLDL 17 08/31/2017 0348   LDLCALC 42 08/31/2017 0348     ASSESSMENT AND PLAN:    ICD-10-CM   1. Coronary artery disease involving native coronary artery of native heart without angina pectoris  I25.10     2. Tachy-brady syndrome (HCC)  I49.5     3. Mixed hyperlipidemia  E78.2     4. CHF (congestive heart failure), NYHA class III, acute on chronic, diastolic (HCC)  I50.33     5. Chronic atrial fibrillation (HCC)  I48.20     6. Benign essential hypertension  I10        Problem List Items Addressed This Visit       Cardiovascular and Mediastinum   Chronic atrial fibrillation (HCC) (Chronic)    In sinus rhythm on auscultation. Continue Eliquis.       Tachy-brady syndrome (HCC)   Coronary artery disease involving native coronary artery of native heart without angina pectoris - Primary    Denies chest pain. Gets short of breath with prolonged exertion. Has no problems working in his yard.       CHF (congestive heart failure), NYHA class III, acute on chronic, diastolic (HCC)    Weight stable.       Benign essential hypertension    Controlled today. Continue same medications.         Other   Mixed hyperlipidemia    Continue atorvastatin 40 mg daily.        Disposition:   Return in about 4 months (around 04/27/2023).    Total time spent: 25 minutes  Signed,  [employer], NP  12/25/2022 1:39 PM     [facility]

## 2022-12-25 NOTE — Assessment & Plan Note (Signed)
Controlled today. Continue same medications. 

## 2022-12-25 NOTE — Assessment & Plan Note (Signed)
Denies chest pain. Gets short of breath with prolonged exertion. Has no problems working in his yard.

## 2022-12-25 NOTE — Assessment & Plan Note (Signed)
Weight stable.

## 2023-01-11 ENCOUNTER — Other Ambulatory Visit: Payer: Self-pay | Admitting: Cardiovascular Disease

## 2023-02-18 ENCOUNTER — Other Ambulatory Visit: Payer: Self-pay | Admitting: Cardiovascular Disease

## 2023-02-18 DIAGNOSIS — I10 Essential (primary) hypertension: Secondary | ICD-10-CM

## 2023-02-22 ENCOUNTER — Other Ambulatory Visit: Payer: Self-pay | Admitting: Cardiovascular Disease

## 2023-02-22 DIAGNOSIS — I10 Essential (primary) hypertension: Secondary | ICD-10-CM

## 2023-03-08 ENCOUNTER — Other Ambulatory Visit: Payer: Self-pay | Admitting: Cardiovascular Disease

## 2023-03-08 DIAGNOSIS — I4892 Unspecified atrial flutter: Secondary | ICD-10-CM

## 2023-04-07 ENCOUNTER — Other Ambulatory Visit: Payer: Self-pay | Admitting: Cardiovascular Disease

## 2023-04-12 ENCOUNTER — Other Ambulatory Visit: Payer: Self-pay | Admitting: Cardiovascular Disease

## 2023-04-27 ENCOUNTER — Encounter: Payer: Self-pay | Admitting: Cardiovascular Disease

## 2023-04-27 ENCOUNTER — Ambulatory Visit (INDEPENDENT_AMBULATORY_CARE_PROVIDER_SITE_OTHER): Payer: Medicare HMO | Admitting: Cardiovascular Disease

## 2023-04-27 VITALS — BP 124/74 | HR 69 | Ht 74.0 in | Wt 232.8 lb

## 2023-04-27 DIAGNOSIS — R0789 Other chest pain: Secondary | ICD-10-CM

## 2023-04-27 DIAGNOSIS — I5033 Acute on chronic diastolic (congestive) heart failure: Secondary | ICD-10-CM | POA: Diagnosis not present

## 2023-04-27 DIAGNOSIS — I495 Sick sinus syndrome: Secondary | ICD-10-CM

## 2023-04-27 DIAGNOSIS — I482 Chronic atrial fibrillation, unspecified: Secondary | ICD-10-CM

## 2023-04-27 DIAGNOSIS — Z9861 Coronary angioplasty status: Secondary | ICD-10-CM

## 2023-04-27 DIAGNOSIS — I2510 Atherosclerotic heart disease of native coronary artery without angina pectoris: Secondary | ICD-10-CM

## 2023-04-27 DIAGNOSIS — E782 Mixed hyperlipidemia: Secondary | ICD-10-CM | POA: Diagnosis not present

## 2023-04-27 DIAGNOSIS — I10 Essential (primary) hypertension: Secondary | ICD-10-CM

## 2023-04-27 MED ORDER — RANOLAZINE ER 500 MG PO TB12
500.0000 mg | ORAL_TABLET | Freq: Two times a day (BID) | ORAL | 2 refills | Status: DC
Start: 2023-04-27 — End: 2023-06-04

## 2023-04-27 NOTE — Progress Notes (Signed)
Cardiology Office Note   Date:  04/27/2023   ID:  SHANGA SHAGENA, DOB 1951-04-20, MRN [id number]  PCP:  Carolynn Serve, MD  Cardiologist:  Adrian Blackwater, MD      History of Present Illness: Wyatt [name] is a 72 y.o. male who presents for  Chief Complaint  Patient presents with   Follow-up    4 month follow up    Has occasional chest pain as tightness      Past Medical History:  Diagnosis Date   Anginal pain (HCC)    Arthritis    Atrial fibrillation (HCC)    Chronic kidney disease    Diabetes mellitus without complication (HCC)    Enlarged prostate    GI bleed    High cholesterol    Hypertension    Liver lesion    Shortness of breath dyspnea      Past Surgical History:  Procedure Laterality Date   CARDIAC CATHETERIZATION Left 07/30/2015   Procedure: Left Heart Cath and Coronary Angiography;  Surgeon: Laurier Nancy, MD;  Location: [facility];  Service: Cardiovascular;  Laterality: Left;   CARDIAC CATHETERIZATION N/A 07/30/2015   Procedure: Coronary Stent Intervention;  Surgeon: Marcina Millard, MD;  Location: [facility];  Service: Cardiovascular;  Laterality: N/A;   CARDIAC CATHETERIZATION Left 11/30/2015   Procedure: Left Heart Cath and Coronary Angiography;  Surgeon: Laurier Nancy, MD;  Location: [facility];  Service: Cardiovascular;  Laterality: Left;   CARDIAC CATHETERIZATION N/A 11/30/2015   Procedure: Coronary Stent Intervention;  Surgeon: Alwyn Pea, MD;  Location: [facility];  Service: Cardiovascular;  Laterality: N/A;   kidney stones     KNEE ARTHROCENTESIS Right    PACEMAKER LEADLESS INSERTION N/A 08/31/2017   Procedure: PACEMAKER LEADLESS INSERTION;  Surgeon: Marcina Millard, MD;  Location: [facility];  Service: Cardiovascular;  Laterality: N/A;     Current Outpatient Medications  Medication Sig Dispense Refill   allopurinol (ZYLOPRIM) 100 MG tablet Take 100 mg by mouth daily.      atorvastatin (LIPITOR) 40 MG tablet Take 1 tablet (40 mg total) by mouth daily. 30 tablet 2   budesonide-formoterol (SYMBICORT) 160-4.5 MCG/ACT inhaler Inhale 2 puffs into the lungs 2 (two) times daily.     ELIQUIS 2.5 MG TABS tablet TAKE 1 TABLET(2.5 MG) BY MOUTH TWICE DAILY 180 tablet 0   FARXIGA 10 MG TABS tablet Take 10 mg by mouth daily.     ferrous sulfate 325 (65 FE) MG tablet Take 325 mg by mouth daily.     finasteride (PROSCAR) 5 MG tablet Take 5 mg by mouth daily.     glipiZIDE (GLUCOTROL) 5 MG tablet Take 5 mg by mouth 2 (two) times daily before a meal.     hydrALAZINE (APRESOLINE) 100 MG tablet TAKE 1 TABLET BY MOUTH TWICE DAILY 180 tablet 0   isosorbide mononitrate (IMDUR) 60 MG 24 hr tablet Take 60 mg by mouth daily.     losartan (COZAAR) 50 MG tablet TAKE 1 TABLET BY MOUTH EVERY DAY 90 tablet 0   nitroGLYCERIN (NITROSTAT) 0.4 MG SL tablet Place 0.4 mg under the tongue every 5 (five) minutes as needed for chest pain.     pantoprazole (PROTONIX) 20 MG tablet Take 20-40 mg by mouth daily.     potassium chloride (K-DUR,KLOR-CON) 10 MEQ tablet Take 20 mEq by mouth 2 (two) times daily.      ranolazine (RANEXA) 500 MG 12  hr tablet Take 1 tablet (500 mg total) by mouth 2 (two) times daily. 60 tablet 2   sucralfate (CARAFATE) 1 g tablet Take 1 g by mouth daily.     tamsulosin (FLOMAX) 0.4 MG CAPS capsule Take 0.4 mg by mouth daily.     tirzepatide [facility]) 5 MG/0.5ML Pen Inject 5 mg into the skin once a week.     torsemide (DEMADEX) 20 MG tablet Take 20 mg by mouth daily.     No current facility-administered medications for this visit.    Allergies:   Niacin and related and Paroxetine hcl    Social History:   reports that he quit smoking about 53 years ago. His smoking use included cigarettes. He has quit using smokeless tobacco. He reports current alcohol use of about 1.0 standard drink of alcohol per week. He reports that he does not use drugs.   Family History:  family history  includes CAD in his father and mother; Dementia in his mother; Parkinson's disease in his mother.    ROS:     Review of Systems  Constitutional: Negative.   HENT: Negative.    Eyes: Negative.   Respiratory: Negative.    Gastrointestinal: Negative.   Genitourinary: Negative.   Musculoskeletal: Negative.   Skin: Negative.   Neurological: Negative.   Endo/Heme/Allergies: Negative.   Psychiatric/Behavioral: Negative.    All other systems reviewed and are negative.     All other systems are reviewed and negative.    PHYSICAL EXAM: VS:  BP 124/74   Pulse 69   Ht 6\' 2"  (1.88 m)   Wt 232 lb 12.8 oz (105.6 kg)   SpO2 96%   BMI 29.89 kg/m  , BMI Body mass index is 29.89 kg/m. Last weight:  Wt Readings from Last 3 Encounters:  04/27/23 232 lb 12.8 oz (105.6 kg)  12/25/22 227 lb 6.4 oz (103.1 kg)  09/25/22 226 lb 9.6 oz (102.8 kg)     Physical Exam Vitals reviewed.  Constitutional:      Appearance: Normal appearance. He is normal weight.  HENT:     Head: Normocephalic.     Nose: Nose normal.     Mouth/Throat:     Mouth: Mucous membranes are moist.  Eyes:     Pupils: Pupils are equal, round, and reactive to light.  Cardiovascular:     Rate and Rhythm: Normal rate and regular rhythm.     Pulses: Normal pulses.     Heart sounds: Normal heart sounds.  Pulmonary:     Effort: Pulmonary effort is normal.  Abdominal:     General: Abdomen is flat. Bowel sounds are normal.  Musculoskeletal:        General: Normal range of motion.     Cervical back: Normal range of motion.  Skin:    General: Skin is warm.  Neurological:     General: No focal deficit present.     Mental Status: He is alert.  Psychiatric:        Mood and Affect: Mood normal.       EKG:   Recent Labs: No results found for requested labs within last 365 days.    Lipid Panel    Component Value Date/Time   CHOL 85 08/31/2017 0348   TRIG 83 08/31/2017 0348   HDL 26 (L) 08/31/2017 0348   CHOLHDL  3.3 08/31/2017 0348   VLDL 17 08/31/2017 0348   LDLCALC 42 08/31/2017 0348      Other studies Reviewed: Additional studies/  records that were reviewed today include:  Review of the above records demonstrates:       No data to display            ASSESSMENT AND PLAN:    ICD-10-CM   1. Tachy-brady syndrome (HCC)  I49.5 ranolazine (RANEXA) 500 MG 12 hr tablet    2. Mixed hyperlipidemia  E78.2 ranolazine (RANEXA) 500 MG 12 hr tablet    3. CHF (congestive heart failure), NYHA class III, acute on chronic, diastolic (HCC)  I50.33 ranolazine (RANEXA) 500 MG 12 hr tablet    4. Coronary artery disease involving native coronary artery of native heart without angina pectoris  I25.10 ranolazine (RANEXA) 500 MG 12 hr tablet    5. Chronic atrial fibrillation (HCC)  I48.20 ranolazine (RANEXA) 500 MG 12 hr tablet    6. Coronary angioplasty status  Z98.61 ranolazine (RANEXA) 500 MG 12 hr tablet    7. Benign essential hypertension  I10 ranolazine (RANEXA) 500 MG 12 hr tablet    8. Other chest pain  R07.89 ranolazine (RANEXA) 500 MG 12 hr tablet   Stress test was mildly abnormal, add ranexa, as creat 2.2 and CCTA not possible. May have primary look at asthma as cause of it       Problem List Items Addressed This Visit       Cardiovascular and Mediastinum   Chronic atrial fibrillation (HCC) (Chronic)   Relevant Medications   ranolazine (RANEXA) 500 MG 12 hr tablet   Tachy-brady syndrome (HCC) - Primary   Relevant Medications   ranolazine (RANEXA) 500 MG 12 hr tablet   Coronary artery disease involving native coronary artery of native heart without angina pectoris   Relevant Medications   ranolazine (RANEXA) 500 MG 12 hr tablet   CHF (congestive heart failure), NYHA class III, acute on chronic, diastolic (HCC)   Relevant Medications   ranolazine (RANEXA) 500 MG 12 hr tablet   Benign essential hypertension   Relevant Medications   ranolazine (RANEXA) 500 MG 12 hr tablet     Other    Coronary angioplasty status   Relevant Medications   ranolazine (RANEXA) 500 MG 12 hr tablet   Mixed hyperlipidemia   Relevant Medications   ranolazine (RANEXA) 500 MG 12 hr tablet   Other Visit Diagnoses     Other chest pain       Stress test was mildly abnormal, add ranexa, as creat 2.2 and CCTA not possible. May have primary look at asthma as cause of it   Relevant Medications   ranolazine (RANEXA) 500 MG 12 hr tablet          Disposition:   Return in about 3 months (around 07/28/2023).    Total time spent: 30 minutes  Signed,  Adrian Blackwater, MD  04/27/2023 2:33 PM    [facility]

## 2023-05-10 ENCOUNTER — Other Ambulatory Visit: Payer: Self-pay | Admitting: Cardiovascular Disease

## 2023-06-02 ENCOUNTER — Other Ambulatory Visit: Payer: Self-pay | Admitting: Cardiovascular Disease

## 2023-06-02 DIAGNOSIS — I10 Essential (primary) hypertension: Secondary | ICD-10-CM

## 2023-06-02 DIAGNOSIS — I4892 Unspecified atrial flutter: Secondary | ICD-10-CM

## 2023-06-04 ENCOUNTER — Other Ambulatory Visit: Payer: Self-pay | Admitting: Cardiovascular Disease

## 2023-06-04 DIAGNOSIS — I2510 Atherosclerotic heart disease of native coronary artery without angina pectoris: Secondary | ICD-10-CM

## 2023-06-04 DIAGNOSIS — I10 Essential (primary) hypertension: Secondary | ICD-10-CM

## 2023-06-04 DIAGNOSIS — I5033 Acute on chronic diastolic (congestive) heart failure: Secondary | ICD-10-CM

## 2023-06-04 DIAGNOSIS — R0789 Other chest pain: Secondary | ICD-10-CM

## 2023-06-04 DIAGNOSIS — E782 Mixed hyperlipidemia: Secondary | ICD-10-CM

## 2023-06-04 DIAGNOSIS — Z9861 Coronary angioplasty status: Secondary | ICD-10-CM

## 2023-06-04 DIAGNOSIS — I482 Chronic atrial fibrillation, unspecified: Secondary | ICD-10-CM

## 2023-06-04 DIAGNOSIS — I495 Sick sinus syndrome: Secondary | ICD-10-CM

## 2023-06-15 ENCOUNTER — Other Ambulatory Visit: Payer: Self-pay | Admitting: Cardiovascular Disease

## 2023-06-15 DIAGNOSIS — I4892 Unspecified atrial flutter: Secondary | ICD-10-CM

## 2023-07-09 ENCOUNTER — Other Ambulatory Visit: Payer: Self-pay | Admitting: Cardiovascular Disease

## 2023-07-20 ENCOUNTER — Encounter: Payer: Self-pay | Admitting: Cardiovascular Disease

## 2023-07-20 ENCOUNTER — Ambulatory Visit: Payer: Medicare HMO | Admitting: Cardiovascular Disease

## 2023-07-20 VITALS — BP 128/72 | HR 65 | Ht 74.0 in | Wt 224.0 lb

## 2023-07-20 DIAGNOSIS — I5033 Acute on chronic diastolic (congestive) heart failure: Secondary | ICD-10-CM

## 2023-07-20 DIAGNOSIS — E782 Mixed hyperlipidemia: Secondary | ICD-10-CM | POA: Diagnosis not present

## 2023-07-20 DIAGNOSIS — I4430 Unspecified atrioventricular block: Secondary | ICD-10-CM

## 2023-07-20 DIAGNOSIS — Z0130 Encounter for examination of blood pressure without abnormal findings: Secondary | ICD-10-CM

## 2023-07-20 DIAGNOSIS — Z0289 Encounter for other administrative examinations: Secondary | ICD-10-CM

## 2023-07-20 DIAGNOSIS — I2510 Atherosclerotic heart disease of native coronary artery without angina pectoris: Secondary | ICD-10-CM

## 2023-07-20 DIAGNOSIS — Z9861 Coronary angioplasty status: Secondary | ICD-10-CM

## 2023-07-20 DIAGNOSIS — I495 Sick sinus syndrome: Secondary | ICD-10-CM

## 2023-07-20 DIAGNOSIS — I482 Chronic atrial fibrillation, unspecified: Secondary | ICD-10-CM

## 2023-07-20 DIAGNOSIS — R0789 Other chest pain: Secondary | ICD-10-CM

## 2023-07-20 NOTE — Progress Notes (Signed)
Cardiology Office Note   Date:  07/20/2023   ID:  Wyatt [name], DOB Nov 05, 1950, MRN [id number]  PCP:  Carolynn Serve, MD  Cardiologist:  Adrian Blackwater, MD      History of Present Illness: Wyatt [name] is a 73 y.o. male who presents for  Chief Complaint  Patient presents with   Follow-up    2 month follow up  Needs clearance to get DOT physical     Ranexa helped the chest pain as gone now      Past Medical History:  Diagnosis Date   Anginal pain (HCC)    Arthritis    Atrial fibrillation (HCC)    Chronic kidney disease    Diabetes mellitus without complication (HCC)    Enlarged prostate    GI bleed    High cholesterol    Hypertension    Liver lesion    Shortness of breath dyspnea      Past Surgical History:  Procedure Laterality Date   CARDIAC CATHETERIZATION Left 07/30/2015   Procedure: Left Heart Cath and Coronary Angiography;  Surgeon: Laurier Nancy, MD;  Location: [facility];  Service: Cardiovascular;  Laterality: Left;   CARDIAC CATHETERIZATION N/A 07/30/2015   Procedure: Coronary Stent Intervention;  Surgeon: Marcina Millard, MD;  Location: [facility];  Service: Cardiovascular;  Laterality: N/A;   CARDIAC CATHETERIZATION Left 11/30/2015   Procedure: Left Heart Cath and Coronary Angiography;  Surgeon: Laurier Nancy, MD;  Location: [facility];  Service: Cardiovascular;  Laterality: Left;   CARDIAC CATHETERIZATION N/A 11/30/2015   Procedure: Coronary Stent Intervention;  Surgeon: Alwyn Pea, MD;  Location: [facility];  Service: Cardiovascular;  Laterality: N/A;   kidney stones     KNEE ARTHROCENTESIS Right    PACEMAKER LEADLESS INSERTION N/A 08/31/2017   Procedure: PACEMAKER LEADLESS INSERTION;  Surgeon: Marcina Millard, MD;  Location: [facility];  Service: Cardiovascular;  Laterality: N/A;     Current Outpatient Medications  Medication Sig Dispense Refill   allopurinol (ZYLOPRIM) 100 MG  tablet Take 100 mg by mouth daily.     atorvastatin (LIPITOR) 40 MG tablet TAKE 1 TABLET(40 MG) BY MOUTH DAILY 30 tablet 2   budesonide-formoterol (SYMBICORT) 160-4.5 MCG/ACT inhaler Inhale 2 puffs into the lungs 2 (two) times daily.     ELIQUIS 2.5 MG TABS tablet TAKE 1 TABLET(2.5 MG) BY MOUTH TWICE DAILY 180 tablet 0   FARXIGA 10 MG TABS tablet Take 10 mg by mouth daily.     ferrous sulfate 325 (65 FE) MG tablet Take 325 mg by mouth daily.     finasteride (PROSCAR) 5 MG tablet Take 5 mg by mouth daily.     glipiZIDE (GLUCOTROL) 5 MG tablet Take 5 mg by mouth 2 (two) times daily before a meal.     hydrALAZINE (APRESOLINE) 100 MG tablet TAKE 1 TABLET BY MOUTH TWICE DAILY 180 tablet 0   isosorbide mononitrate (IMDUR) 60 MG 24 hr tablet Take 60 mg by mouth daily.     losartan (COZAAR) 50 MG tablet TAKE 1 TABLET BY MOUTH EVERY DAY 90 tablet 0   nitroGLYCERIN (NITROSTAT) 0.4 MG SL tablet Place 0.4 mg under the tongue every 5 (five) minutes as needed for chest pain.     pantoprazole (PROTONIX) 20 MG tablet Take 20-40 mg by mouth daily.     potassium chloride (K-DUR,KLOR-CON) 10 MEQ tablet Take 20 mEq by mouth 2 (two) times daily.  ranolazine (RANEXA) 500 MG 12 hr tablet TAKE 1 TABLET(500 MG) BY MOUTH TWICE DAILY 60 tablet 2   sucralfate (CARAFATE) 1 g tablet Take 1 g by mouth daily.     tamsulosin (FLOMAX) 0.4 MG CAPS capsule Take 0.4 mg by mouth daily.     tirzepatide [facility]) 5 MG/0.5ML Pen Inject 5 mg into the skin once a week.     torsemide (DEMADEX) 20 MG tablet Take 20 mg by mouth daily.     No current facility-administered medications for this visit.    Allergies:   Niacin and related and Paroxetine hcl    Social History:   reports that he quit smoking about 54 years ago. His smoking use included cigarettes. He has quit using smokeless tobacco. He reports current alcohol use of about 1.0 standard drink of alcohol per week. He reports that he does not use drugs.   Family History:   family history includes CAD in his father and mother; Dementia in his mother; Parkinson's disease in his mother.    ROS:     Review of Systems  Constitutional: Negative.   HENT: Negative.    Eyes: Negative.   Respiratory: Negative.    Gastrointestinal: Negative.   Genitourinary: Negative.   Musculoskeletal: Negative.   Skin: Negative.   Neurological: Negative.   Endo/Heme/Allergies: Negative.   Psychiatric/Behavioral: Negative.    All other systems reviewed and are negative.     All other systems are reviewed and negative.    PHYSICAL EXAM: VS:  BP 128/72   Pulse 65   Ht 6\' 2"  (1.88 m)   Wt 224 lb (101.6 kg)   SpO2 98%   BMI 28.76 kg/m  , BMI Body mass index is 28.76 kg/m. Last weight:  Wt Readings from Last 3 Encounters:  07/20/23 224 lb (101.6 kg)  04/27/23 232 lb 12.8 oz (105.6 kg)  12/25/22 227 lb 6.4 oz (103.1 kg)     Physical Exam Vitals reviewed.  Constitutional:      Appearance: Normal appearance. He is normal weight.  HENT:     Head: Normocephalic.     Nose: Nose normal.     Mouth/Throat:     Mouth: Mucous membranes are moist.  Eyes:     Pupils: Pupils are equal, round, and reactive to light.  Cardiovascular:     Rate and Rhythm: Normal rate and regular rhythm.     Pulses: Normal pulses.     Heart sounds: Normal heart sounds.  Pulmonary:     Effort: Pulmonary effort is normal.  Abdominal:     General: Abdomen is flat. Bowel sounds are normal.  Musculoskeletal:        General: Normal range of motion.     Cervical back: Normal range of motion.  Skin:    General: Skin is warm.  Neurological:     General: No focal deficit present.     Mental Status: He is alert.  Psychiatric:        Mood and Affect: Mood normal.       EKG:   Recent Labs: No results found for requested labs within last 365 days.    Lipid Panel    Component Value Date/Time   CHOL 85 08/31/2017 0348   TRIG 83 08/31/2017 0348   HDL 26 (L) 08/31/2017 0348   CHOLHDL  3.3 08/31/2017 0348   VLDL 17 08/31/2017 0348   LDLCALC 42 08/31/2017 0348      Other studies Reviewed: Additional studies/ records that were  reviewed today include:  Review of the above records demonstrates:       No data to display            ASSESSMENT AND PLAN:    ICD-10-CM   1. Encounter for examination required by Department of Transportation (DOT)  Z02.89    Cleared for DOT, doing well. Had normal LV function on echo. Pace maker is fine. May drive as needed.    2. Tachy-brady syndrome (HCC)  I49.5     3. Mixed hyperlipidemia  E78.2     4. CHF (congestive heart failure), NYHA class III, acute on chronic, diastolic (HCC)  I50.33     5. Coronary artery disease involving native coronary artery of native heart without angina pectoris  I25.10     6. Chronic atrial fibrillation (HCC)  I48.20     7. Coronary angioplasty status  Z98.61     8. Other chest pain  R07.89    No further chest pain after taking ranexa 500 bid, may go to 100 bid if recurent CP    9. AV block  I44.30        Problem List Items Addressed This Visit       Cardiovascular and Mediastinum   Chronic atrial fibrillation (HCC) (Chronic)   Tachy-brady syndrome (HCC)   Coronary artery disease involving native coronary artery of native heart without angina pectoris   CHF (congestive heart failure), NYHA class III, acute on chronic, diastolic (HCC)     Other   Coronary angioplasty status   Mixed hyperlipidemia   Other Visit Diagnoses       Encounter for examination required by Department of Transportation (DOT)    -  Primary   Cleared for DOT, doing well. Had normal LV function on echo. Pace maker is fine. May drive as needed.     Other chest pain       No further chest pain after taking ranexa 500 bid, may go to 100 bid if recurent CP     AV block              Disposition:   Return in about 3 months (around 10/17/2023).    Total time spent: 45 minutes  Signed,  Adrian Blackwater, MD   07/20/2023 11:29 AM    [facility]

## 2023-07-27 ENCOUNTER — Ambulatory Visit: Payer: Medicare HMO | Admitting: Cardiovascular Disease

## 2023-08-10 ENCOUNTER — Ambulatory Visit: Payer: Medicare HMO | Admitting: Cardiovascular Disease

## 2023-09-14 ENCOUNTER — Other Ambulatory Visit: Payer: Self-pay | Admitting: Cardiovascular Disease

## 2023-10-16 ENCOUNTER — Other Ambulatory Visit: Payer: Self-pay | Admitting: Cardiovascular Disease

## 2023-10-18 ENCOUNTER — Encounter: Payer: Self-pay | Admitting: Cardiovascular Disease

## 2023-10-18 ENCOUNTER — Ambulatory Visit: Payer: Medicare HMO | Admitting: Cardiovascular Disease

## 2023-10-18 VITALS — BP 130/78 | HR 76 | Ht 74.0 in | Wt 227.4 lb

## 2023-10-18 DIAGNOSIS — R0789 Other chest pain: Secondary | ICD-10-CM

## 2023-10-18 DIAGNOSIS — I495 Sick sinus syndrome: Secondary | ICD-10-CM

## 2023-10-18 DIAGNOSIS — I482 Chronic atrial fibrillation, unspecified: Secondary | ICD-10-CM

## 2023-10-18 DIAGNOSIS — I5033 Acute on chronic diastolic (congestive) heart failure: Secondary | ICD-10-CM

## 2023-10-18 DIAGNOSIS — I2510 Atherosclerotic heart disease of native coronary artery without angina pectoris: Secondary | ICD-10-CM

## 2023-10-18 DIAGNOSIS — Z0130 Encounter for examination of blood pressure without abnormal findings: Secondary | ICD-10-CM

## 2023-10-18 DIAGNOSIS — E782 Mixed hyperlipidemia: Secondary | ICD-10-CM | POA: Diagnosis not present

## 2023-10-18 DIAGNOSIS — Z9861 Coronary angioplasty status: Secondary | ICD-10-CM

## 2023-10-18 NOTE — Progress Notes (Signed)
 Cardiology Office Note   Date:  10/18/2023   ID:  Wyatt [name], DOB September 10, 1950, MRN [id number]  PCP:  Danette Duos, MD  Cardiologist:  Debborah Fairly, MD      History of Present Illness: Wyatt [name] is a 73 y.o. male who presents for  Chief Complaint  Patient presents with   Follow-up    3 month follow up    Patient states that when he exerts he has some shortness of breath but sitting down he is fine.  No further chest pain after starting Ranexa  500 p.o. twice daily.      Past Medical History:  Diagnosis Date   Anginal pain (HCC)    Arthritis    Atrial fibrillation (HCC)    Chronic kidney disease    Diabetes mellitus without complication (HCC)    Enlarged prostate    GI bleed    High cholesterol    Hypertension    Liver lesion    Shortness of breath dyspnea      Past Surgical History:  Procedure Laterality Date   CARDIAC CATHETERIZATION Left 07/30/2015   Procedure: Left Heart Cath and Coronary Angiography;  Surgeon: Cherrie Cornwall, MD;  Location: [facility];  Service: Cardiovascular;  Laterality: Left;   CARDIAC CATHETERIZATION N/A 07/30/2015   Procedure: Coronary Stent Intervention;  Surgeon: Percival Brace, MD;  Location: [facility];  Service: Cardiovascular;  Laterality: N/A;   CARDIAC CATHETERIZATION Left 11/30/2015   Procedure: Left Heart Cath and Coronary Angiography;  Surgeon: Cherrie Cornwall, MD;  Location: [facility];  Service: Cardiovascular;  Laterality: Left;   CARDIAC CATHETERIZATION N/A 11/30/2015   Procedure: Coronary Stent Intervention;  Surgeon: Antonette Batters, MD;  Location: [facility];  Service: Cardiovascular;  Laterality: N/A;   kidney stones     KNEE ARTHROCENTESIS Right    PACEMAKER LEADLESS INSERTION N/A 08/31/2017   Procedure: PACEMAKER LEADLESS INSERTION;  Surgeon: Percival Brace, MD;  Location: [facility];  Service: Cardiovascular;  Laterality: N/A;     Current  Outpatient Medications  Medication Sig Dispense Refill   allopurinol  (ZYLOPRIM ) 100 MG tablet Take 100 mg by mouth daily.     atorvastatin  (LIPITOR) 40 MG tablet TAKE ONE TABLET BY MOUTH EVERY DAY 30 tablet 0   budesonide-formoterol (SYMBICORT) 160-4.5 MCG/ACT inhaler Inhale 2 puffs into the lungs 2 (two) times daily.     ELIQUIS  2.5 MG TABS tablet TAKE 1 TABLET(2.5 MG) BY MOUTH TWICE DAILY 180 tablet 0   FARXIGA 10 MG TABS tablet Take 10 mg by mouth daily.     ferrous sulfate  325 (65 FE) MG tablet Take 325 mg by mouth daily.     finasteride  (PROSCAR ) 5 MG tablet Take 5 mg by mouth daily.     glipiZIDE  (GLUCOTROL ) 5 MG tablet Take 5 mg by mouth 2 (two) times daily before a meal.     hydrALAZINE  (APRESOLINE ) 100 MG tablet TAKE 1 TABLET BY MOUTH TWICE DAILY 180 tablet 0   isosorbide  mononitrate (IMDUR ) 60 MG 24 hr tablet Take 60 mg by mouth daily.     losartan (COZAAR) 50 MG tablet TAKE 1 TABLET BY MOUTH EVERY DAY 90 tablet 0   nitroGLYCERIN  (NITROSTAT ) 0.4 MG SL tablet Place 0.4 mg under the tongue every 5 (five) minutes as needed for chest pain.     pantoprazole  (PROTONIX ) 20 MG tablet Take 20-40 mg by mouth daily.     potassium chloride  (K-DUR,KLOR-CON )  10 MEQ tablet Take 20 mEq by mouth 2 (two) times daily.      ranolazine  (RANEXA ) 500 MG 12 hr tablet TAKE 1 TABLET(500 MG) BY MOUTH TWICE DAILY 60 tablet 2   sucralfate  (CARAFATE ) 1 g tablet Take 1 g by mouth daily.     tamsulosin  (FLOMAX ) 0.4 MG CAPS capsule Take 0.4 mg by mouth daily.     tirzepatide (MOUNJARO) 5 MG/0.5ML Pen Inject 5 mg into the skin once a week.     torsemide  (DEMADEX ) 20 MG tablet Take 20 mg by mouth daily.     No current facility-administered medications for this visit.    Allergies:   Niacin  and related and Paroxetine hcl    Social History:   reports that he quit smoking about 54 years ago. His smoking use included cigarettes. He has quit using smokeless tobacco. He reports current alcohol use of about 1.0 standard  drink of alcohol per week. He reports that he does not use drugs.   Family History:  family history includes CAD in his father and mother; Dementia in his mother; Parkinson's disease in his mother.    ROS:     Review of Systems  Constitutional: Negative.   HENT: Negative.    Eyes: Negative.   Respiratory: Negative.    Gastrointestinal: Negative.   Genitourinary: Negative.   Musculoskeletal: Negative.   Skin: Negative.   Neurological: Negative.   Endo/Heme/Allergies: Negative.   Psychiatric/Behavioral: Negative.    All other systems reviewed and are negative.     All other systems are reviewed and negative.    PHYSICAL EXAM: VS:  BP 130/78   Pulse 76   Ht 6\' 2"  (1.88 m)   Wt 227 lb 6.4 oz (103.1 kg)   SpO2 97%   BMI 29.20 kg/m  , BMI Body mass index is 29.2 kg/m. Last weight:  Wt Readings from Last 3 Encounters:  10/18/23 227 lb 6.4 oz (103.1 kg)  07/20/23 224 lb (101.6 kg)  04/27/23 232 lb 12.8 oz (105.6 kg)     Physical Exam Vitals reviewed.  Constitutional:      Appearance: Normal appearance. He is normal weight.  HENT:     Head: Normocephalic.     Nose: Nose normal.     Mouth/Throat:     Mouth: Mucous membranes are moist.  Eyes:     Pupils: Pupils are equal, round, and reactive to light.  Cardiovascular:     Rate and Rhythm: Normal rate and regular rhythm.     Pulses: Normal pulses.     Heart sounds: Normal heart sounds.  Pulmonary:     Effort: Pulmonary effort is normal.  Abdominal:     General: Abdomen is flat. Bowel sounds are normal.  Musculoskeletal:        General: Normal range of motion.     Cervical back: Normal range of motion.  Skin:    General: Skin is warm.  Neurological:     General: No focal deficit present.     Mental Status: He is alert.  Psychiatric:        Mood and Affect: Mood normal.       EKG:   Recent Labs: No results found for requested labs within last 365 days.    Lipid Panel    Component Value Date/Time    CHOL 85 08/31/2017 0348   TRIG 83 08/31/2017 0348   HDL 26 (L) 08/31/2017 0348   CHOLHDL 3.3 08/31/2017 0348   VLDL 17 08/31/2017 0348  LDLCALC 42 08/31/2017 0348      Other studies Reviewed: Additional studies/ records that were reviewed today include:  Review of the above records demonstrates:       No data to display            ASSESSMENT AND PLAN:    ICD-10-CM   1. Tachy-brady syndrome (HCC)  I49.5     2. Mixed hyperlipidemia  E78.2     3. CHF (congestive heart failure), NYHA class III, acute on chronic, diastolic (HCC)  I50.33    Has some swelling of the legs but advised propping up at night with pillows because appears to be more gravity related.    4. Coronary artery disease involving native coronary artery of native heart without angina pectoris  I25.10     5. Other chest pain  R07.89    No further chest pain with Ranexa .    6. Coronary angioplasty status  Z98.61     7. Chronic atrial fibrillation (HCC)  I48.20        Problem List Items Addressed This Visit       Cardiovascular and Mediastinum   Chronic atrial fibrillation (HCC) (Chronic)   Tachy-brady syndrome (HCC) - Primary   Coronary artery disease involving native coronary artery of native heart without angina pectoris   CHF (congestive heart failure), NYHA class III, acute on chronic, diastolic (HCC)     Other   Coronary angioplasty status   Mixed hyperlipidemia   Other Visit Diagnoses       Other chest pain       No further chest pain with Ranexa .          Disposition:   Return in about 3 months (around 01/18/2024).    Total time spent: 40 minutes  Signed,  Debborah Fairly, MD  10/18/2023 1:34 PM    [facility]

## 2023-11-12 ENCOUNTER — Other Ambulatory Visit: Payer: Self-pay | Admitting: Cardiovascular Disease

## 2023-11-12 DIAGNOSIS — I10 Essential (primary) hypertension: Secondary | ICD-10-CM

## 2023-11-20 ENCOUNTER — Other Ambulatory Visit: Payer: Self-pay | Admitting: Cardiovascular Disease

## 2023-11-20 DIAGNOSIS — I495 Sick sinus syndrome: Secondary | ICD-10-CM

## 2023-11-20 DIAGNOSIS — I482 Chronic atrial fibrillation, unspecified: Secondary | ICD-10-CM

## 2023-11-20 DIAGNOSIS — Z9861 Coronary angioplasty status: Secondary | ICD-10-CM

## 2023-11-20 DIAGNOSIS — E782 Mixed hyperlipidemia: Secondary | ICD-10-CM

## 2023-11-20 DIAGNOSIS — R0789 Other chest pain: Secondary | ICD-10-CM

## 2023-11-20 DIAGNOSIS — I10 Essential (primary) hypertension: Secondary | ICD-10-CM

## 2023-11-20 DIAGNOSIS — I5033 Acute on chronic diastolic (congestive) heart failure: Secondary | ICD-10-CM

## 2023-11-20 DIAGNOSIS — I2510 Atherosclerotic heart disease of native coronary artery without angina pectoris: Secondary | ICD-10-CM

## 2023-12-14 ENCOUNTER — Other Ambulatory Visit: Payer: Self-pay | Admitting: Cardiovascular Disease

## 2023-12-14 DIAGNOSIS — I4892 Unspecified atrial flutter: Secondary | ICD-10-CM

## 2024-01-24 ENCOUNTER — Encounter: Payer: Self-pay | Admitting: Cardiovascular Disease

## 2024-01-24 ENCOUNTER — Ambulatory Visit (INDEPENDENT_AMBULATORY_CARE_PROVIDER_SITE_OTHER): Admitting: Cardiovascular Disease

## 2024-01-24 VITALS — BP 134/60 | HR 71 | Ht 74.0 in | Wt 218.0 lb

## 2024-01-24 DIAGNOSIS — I482 Chronic atrial fibrillation, unspecified: Secondary | ICD-10-CM

## 2024-01-24 DIAGNOSIS — E782 Mixed hyperlipidemia: Secondary | ICD-10-CM | POA: Diagnosis not present

## 2024-01-24 DIAGNOSIS — I495 Sick sinus syndrome: Secondary | ICD-10-CM

## 2024-01-24 DIAGNOSIS — I5033 Acute on chronic diastolic (congestive) heart failure: Secondary | ICD-10-CM | POA: Diagnosis not present

## 2024-01-24 DIAGNOSIS — Z0130 Encounter for examination of blood pressure without abnormal findings: Secondary | ICD-10-CM

## 2024-01-24 DIAGNOSIS — Z9861 Coronary angioplasty status: Secondary | ICD-10-CM

## 2024-01-24 NOTE — Progress Notes (Signed)
 Cardiology Office Note   Date:  01/24/2024   ID:  Wyatt [name], Wyatt [name] [birth year], MRN [id number]  PCP:  Bascom Rush, MD  Cardiologist:  Denyse Bathe, MD      History of Present Illness: Wyatt [name] is a 73 y.o. male who presents for  Chief Complaint  Patient presents with   Follow-up    3 months follow up    Feeling fine, no chest pains or SOB.      Past Medical History:  Diagnosis Date   Anginal pain (HCC)    Arthritis    Atrial fibrillation (HCC)    Chronic kidney disease    Diabetes mellitus without complication (HCC)    Enlarged prostate    GI bleed    High cholesterol    Hypertension    Liver lesion    Shortness of breath dyspnea      Past Surgical History:  Procedure Laterality Date   CARDIAC CATHETERIZATION Left 07/30/2015   Procedure: Left Heart Cath and Coronary Angiography;  Surgeon: Denyse DELENA Bathe, MD;  Location: [facility];  Service: Cardiovascular;  Laterality: Left;   CARDIAC CATHETERIZATION N/A 07/30/2015   Procedure: Coronary Stent Intervention;  Surgeon: Marsa Dooms, MD;  Location: [facility];  Service: Cardiovascular;  Laterality: N/A;   CARDIAC CATHETERIZATION Left 11/30/2015   Procedure: Left Heart Cath and Coronary Angiography;  Surgeon: Denyse DELENA Bathe, MD;  Location: [facility];  Service: Cardiovascular;  Laterality: Left;   CARDIAC CATHETERIZATION N/A 11/30/2015   Procedure: Coronary Stent Intervention;  Surgeon: Cara JONETTA Lovelace, MD;  Location: [facility];  Service: Cardiovascular;  Laterality: N/A;   kidney stones     KNEE ARTHROCENTESIS Right    PACEMAKER LEADLESS INSERTION N/A 08/31/2017   Procedure: PACEMAKER LEADLESS INSERTION;  Surgeon: Dooms Marsa, MD;  Location: [facility];  Service: Cardiovascular;  Laterality: N/A;     Current Outpatient Medications  Medication Sig Dispense Refill   allopurinol  (ZYLOPRIM ) 100 MG tablet Take 100 mg by mouth daily.      atorvastatin  (LIPITOR) 40 MG tablet TAKE ONE TABLET BY MOUTH EVERY DAY 30 tablet 0   budesonide-formoterol (SYMBICORT) 160-4.5 MCG/ACT inhaler Inhale 2 puffs into the lungs 2 (two) times daily.     ELIQUIS  2.5 MG TABS tablet TAKE ONE TABLET BY MOUTH TWICE DAILY 180 tablet 0   FARXIGA 10 MG TABS tablet Take 10 mg by mouth daily.     ferrous sulfate  325 (65 FE) MG tablet Take 325 mg by mouth daily.     finasteride  (PROSCAR ) 5 MG tablet Take 5 mg by mouth daily.     glipiZIDE  (GLUCOTROL ) 5 MG tablet Take 5 mg by mouth 2 (two) times daily before a meal.     hydrALAZINE  (APRESOLINE ) 100 MG tablet TAKE ONE TABLET BY MOUTH TWICE DAILY 180 tablet 0   isosorbide  mononitrate (IMDUR ) 60 MG 24 hr tablet Take 60 mg by mouth daily.     losartan (COZAAR) 50 MG tablet TAKE 1 TABLET BY MOUTH EVERY DAY 90 tablet 0   nitroGLYCERIN  (NITROSTAT ) 0.4 MG SL tablet Place 0.4 mg under the tongue every 5 (five) minutes as needed for chest pain.     pantoprazole  (PROTONIX ) 20 MG tablet Take 20-40 mg by mouth daily.     potassium chloride  (K-DUR,KLOR-CON ) 10 MEQ tablet Take 20 mEq by mouth 2 (two) times daily.      ranolazine  (RANEXA ) 500 MG 12 hr tablet  TAKE ONE TABLET BY MOUTH TWICE DAILY 180 tablet 0   sucralfate  (CARAFATE ) 1 g tablet Take 1 g by mouth daily.     tamsulosin  (FLOMAX ) 0.4 MG CAPS capsule Take 0.4 mg by mouth daily.     tirzepatide (MOUNJARO) 5 MG/0.5ML Pen Inject 5 mg into the skin once a week.     torsemide  (DEMADEX ) 20 MG tablet Take 20 mg by mouth daily.     No current facility-administered medications for this visit.    Allergies:   Niacin  and related and Paroxetine hcl    Social History:   reports that he quit smoking about 54 years ago. His smoking use included cigarettes. He has quit using smokeless tobacco. He reports current alcohol use of about 1.0 standard drink of alcohol per week. He reports that he does not use drugs.   Family History:  family history includes CAD in his father and  mother; Dementia in his mother; Parkinson's disease in his mother.    ROS:     Review of Systems  Constitutional: Negative.   HENT: Negative.    Eyes: Negative.   Respiratory: Negative.    Gastrointestinal: Negative.   Genitourinary: Negative.   Musculoskeletal: Negative.   Skin: Negative.   Neurological: Negative.   Endo/Heme/Allergies: Negative.   Psychiatric/Behavioral: Negative.    All other systems reviewed and are negative.     All other systems are reviewed and negative.    PHYSICAL EXAM: VS:  BP 134/60   Pulse 71   Ht 6' 2 (1.88 m)   Wt 218 lb (98.9 kg)   SpO2 96%   BMI 27.99 kg/m  , BMI Body mass index is 27.99 kg/m. Last weight:  Wt Readings from Last 3 Encounters:  01/24/24 218 lb (98.9 kg)  10/18/23 227 lb 6.4 oz (103.1 kg)  07/20/23 224 lb (101.6 kg)     Physical Exam Vitals reviewed.  Constitutional:      Appearance: Normal appearance. He is normal weight.  HENT:     Head: Normocephalic.     Nose: Nose normal.     Mouth/Throat:     Mouth: Mucous membranes are moist.  Eyes:     Pupils: Pupils are equal, round, and reactive to light.  Cardiovascular:     Rate and Rhythm: Normal rate and regular rhythm.     Pulses: Normal pulses.     Heart sounds: Normal heart sounds.  Pulmonary:     Effort: Pulmonary effort is normal.  Abdominal:     General: Abdomen is flat. Bowel sounds are normal.  Musculoskeletal:        General: Normal range of motion.     Cervical back: Normal range of motion.  Skin:    General: Skin is warm.  Neurological:     General: No focal deficit present.     Mental Status: He is alert.  Psychiatric:        Mood and Affect: Mood normal.       EKG:   Recent Labs: No results found for requested labs within last 365 days.    Lipid Panel    Component Value Date/Time   CHOL 85 08/31/2017 0348   TRIG 83 08/31/2017 0348   HDL 26 (L) 08/31/2017 0348   CHOLHDL 3.3 08/31/2017 0348   VLDL 17 08/31/2017 0348    LDLCALC 42 08/31/2017 0348      Other studies Reviewed: Additional studies/ records that were reviewed today include:  Review of the above records demonstrates:  No data to display            ASSESSMENT AND PLAN:    ICD-10-CM   1. Tachy-brady syndrome (HCC)  I49.5     2. Mixed hyperlipidemia  E78.2     3. CHF (congestive heart failure), NYHA class III, acute on chronic, diastolic (HCC)  I50.33     4. Chronic atrial fibrillation (HCC)  I48.20     5. Coronary angioplasty status  Z98.61    No chest pains.       Problem List Items Addressed This Visit       Cardiovascular and Mediastinum   Chronic atrial fibrillation (HCC) (Chronic)   Tachy-brady syndrome (HCC) - Primary   CHF (congestive heart failure), NYHA class III, acute on chronic, diastolic (HCC)     Other   Coronary angioplasty status   Mixed hyperlipidemia       Disposition:   Return in about 4 months (around 05/25/2024).    Total time spent: 30 minutes  Signed,  Denyse Bathe, MD  01/24/2024 1:16 PM    [facility]

## 2024-02-07 ENCOUNTER — Other Ambulatory Visit: Payer: Self-pay

## 2024-02-07 DIAGNOSIS — I5033 Acute on chronic diastolic (congestive) heart failure: Secondary | ICD-10-CM

## 2024-02-07 DIAGNOSIS — I10 Essential (primary) hypertension: Secondary | ICD-10-CM

## 2024-02-07 DIAGNOSIS — Z9861 Coronary angioplasty status: Secondary | ICD-10-CM

## 2024-02-07 DIAGNOSIS — I482 Chronic atrial fibrillation, unspecified: Secondary | ICD-10-CM

## 2024-02-07 DIAGNOSIS — E782 Mixed hyperlipidemia: Secondary | ICD-10-CM

## 2024-02-07 DIAGNOSIS — I2510 Atherosclerotic heart disease of native coronary artery without angina pectoris: Secondary | ICD-10-CM

## 2024-02-07 DIAGNOSIS — R0789 Other chest pain: Secondary | ICD-10-CM

## 2024-02-07 DIAGNOSIS — I495 Sick sinus syndrome: Secondary | ICD-10-CM

## 2024-02-07 MED ORDER — RANOLAZINE ER 500 MG PO TB12: 500.0000 mg | ORAL_TABLET | Freq: Two times a day (BID) | ORAL | 0 refills | Status: DC

## 2024-03-05 ENCOUNTER — Other Ambulatory Visit: Payer: Self-pay | Admitting: Cardiovascular Disease

## 2024-03-05 DIAGNOSIS — I10 Essential (primary) hypertension: Secondary | ICD-10-CM

## 2024-03-10 ENCOUNTER — Telehealth: Payer: Self-pay

## 2024-03-10 NOTE — Telephone Encounter (Signed)
 Patient left a voicemail, he needs a call back to [phone]. He needs C-PAP supplies from a new company. This all the information he left

## 2024-03-17 NOTE — Telephone Encounter (Signed)
 He will call back with information for me to send to the new company

## 2024-03-17 NOTE — Telephone Encounter (Signed)
 Has [employer] and no longer in network with [employer]

## 2024-05-05 ENCOUNTER — Other Ambulatory Visit: Payer: Self-pay | Admitting: Cardiovascular Disease

## 2024-05-05 DIAGNOSIS — I482 Chronic atrial fibrillation, unspecified: Secondary | ICD-10-CM

## 2024-05-05 DIAGNOSIS — E782 Mixed hyperlipidemia: Secondary | ICD-10-CM

## 2024-05-05 DIAGNOSIS — I495 Sick sinus syndrome: Secondary | ICD-10-CM

## 2024-05-05 DIAGNOSIS — I2510 Atherosclerotic heart disease of native coronary artery without angina pectoris: Secondary | ICD-10-CM

## 2024-05-05 DIAGNOSIS — Z9861 Coronary angioplasty status: Secondary | ICD-10-CM

## 2024-05-05 DIAGNOSIS — I10 Essential (primary) hypertension: Secondary | ICD-10-CM

## 2024-05-05 DIAGNOSIS — I5033 Acute on chronic diastolic (congestive) heart failure: Secondary | ICD-10-CM

## 2024-05-05 DIAGNOSIS — R0789 Other chest pain: Secondary | ICD-10-CM

## 2024-05-22 ENCOUNTER — Encounter: Payer: Self-pay | Admitting: Cardiovascular Disease

## 2024-05-22 ENCOUNTER — Ambulatory Visit: Admitting: Cardiovascular Disease

## 2024-05-22 VITALS — BP 124/69 | HR 74 | Ht 74.0 in | Wt 219.6 lb

## 2024-05-22 DIAGNOSIS — I495 Sick sinus syndrome: Secondary | ICD-10-CM

## 2024-05-22 DIAGNOSIS — I2510 Atherosclerotic heart disease of native coronary artery without angina pectoris: Secondary | ICD-10-CM | POA: Diagnosis not present

## 2024-05-22 DIAGNOSIS — Z9861 Coronary angioplasty status: Secondary | ICD-10-CM | POA: Diagnosis not present

## 2024-05-22 DIAGNOSIS — R600 Localized edema: Secondary | ICD-10-CM

## 2024-05-22 DIAGNOSIS — E782 Mixed hyperlipidemia: Secondary | ICD-10-CM

## 2024-05-22 DIAGNOSIS — I482 Chronic atrial fibrillation, unspecified: Secondary | ICD-10-CM | POA: Diagnosis not present

## 2024-05-22 DIAGNOSIS — I10 Essential (primary) hypertension: Secondary | ICD-10-CM | POA: Diagnosis not present

## 2024-05-22 DIAGNOSIS — I5033 Acute on chronic diastolic (congestive) heart failure: Secondary | ICD-10-CM

## 2024-05-22 NOTE — Progress Notes (Signed)
 Cardiology Office Note   Date:  05/22/2024   ID:  Wyatt [name], DOB 1950-08-30, MRN [id number]  PCP:  Bascom Rush, MD  Cardiologist:  Denyse Bathe, MD      History of Present Illness: Wyatt [name] is a 73 y.o. male who presents for  Chief Complaint  Patient presents with   Follow-up    4 month follow up    Had swelling of legs 2 weeks ago.      Past Medical History:  Diagnosis Date   Anginal pain    Arthritis    Atrial fibrillation (HCC)    Chronic kidney disease    Diabetes mellitus without complication (HCC)    Enlarged prostate    GI bleed    High cholesterol    Hypertension    Liver lesion    Shortness of breath dyspnea      Past Surgical History:  Procedure Laterality Date   CARDIAC CATHETERIZATION Left 07/30/2015   Procedure: Left Heart Cath and Coronary Angiography;  Surgeon: Denyse DELENA Bathe, MD;  Location: [facility];  Service: Cardiovascular;  Laterality: Left;   CARDIAC CATHETERIZATION N/A 07/30/2015   Procedure: Coronary Stent Intervention;  Surgeon: Marsa Dooms, MD;  Location: [facility];  Service: Cardiovascular;  Laterality: N/A;   CARDIAC CATHETERIZATION Left 11/30/2015   Procedure: Left Heart Cath and Coronary Angiography;  Surgeon: Denyse DELENA Bathe, MD;  Location: [facility];  Service: Cardiovascular;  Laterality: Left;   CARDIAC CATHETERIZATION N/A 11/30/2015   Procedure: Coronary Stent Intervention;  Surgeon: Cara JONETTA Lovelace, MD;  Location: [facility];  Service: Cardiovascular;  Laterality: N/A;   kidney stones     KNEE ARTHROCENTESIS Right    PACEMAKER LEADLESS INSERTION N/A 08/31/2017   Procedure: PACEMAKER LEADLESS INSERTION;  Surgeon: Dooms Marsa, MD;  Location: [facility];  Service: Cardiovascular;  Laterality: N/A;     Current Outpatient Medications  Medication Sig Dispense Refill   allopurinol  (ZYLOPRIM ) 100 MG tablet Take 100 mg by mouth daily.     atorvastatin   (LIPITOR) 40 MG tablet TAKE ONE TABLET BY MOUTH EVERY DAY 30 tablet 0   budesonide-formoterol (SYMBICORT) 160-4.5 MCG/ACT inhaler Inhale 2 puffs into the lungs 2 (two) times daily.     ELIQUIS  2.5 MG TABS tablet TAKE ONE TABLET BY MOUTH TWICE DAILY 180 tablet 0   FARXIGA 10 MG TABS tablet Take 10 mg by mouth daily.     ferrous sulfate  325 (65 FE) MG tablet Take 325 mg by mouth daily.     finasteride  (PROSCAR ) 5 MG tablet Take 5 mg by mouth daily.     glipiZIDE  (GLUCOTROL ) 5 MG tablet Take 5 mg by mouth 2 (two) times daily before a meal.     hydrALAZINE  (APRESOLINE ) 100 MG tablet TAKE ONE TABLET BY MOUTH TWICE DAILY 180 tablet 0   isosorbide  mononitrate (IMDUR ) 60 MG 24 hr tablet Take 60 mg by mouth daily.     losartan (COZAAR) 50 MG tablet TAKE 1 TABLET BY MOUTH EVERY DAY 90 tablet 0   nitroGLYCERIN  (NITROSTAT ) 0.4 MG SL tablet Place 0.4 mg under the tongue every 5 (five) minutes as needed for chest pain.     pantoprazole  (PROTONIX ) 20 MG tablet Take 20-40 mg by mouth daily.     potassium chloride  (K-DUR,KLOR-CON ) 10 MEQ tablet Take 20 mEq by mouth 2 (two) times daily.      ranolazine  (RANEXA ) 500 MG 12 hr tablet TAKE  ONE TABLET BY MOUTH TWO TIMES A DAY 180 tablet 0   sucralfate  (CARAFATE ) 1 g tablet Take 1 g by mouth daily.     tamsulosin  (FLOMAX ) 0.4 MG CAPS capsule Take 0.4 mg by mouth daily.     tirzepatide (MOUNJARO) 5 MG/0.5ML Pen Inject 5 mg into the skin once a week.     torsemide  (DEMADEX ) 20 MG tablet Take 20 mg by mouth daily.     No current facility-administered medications for this visit.    Allergies:   Niacin  and related and Paroxetine hcl    Social History:   reports that he quit smoking about 54 years ago. His smoking use included cigarettes. He has quit using smokeless tobacco. He reports current alcohol use of about 1.0 standard drink of alcohol per week. He reports that he does not use drugs.   Family History:  family history includes CAD in his father and mother;  Dementia in his mother; Parkinson's disease in his mother.    ROS:     Review of Systems  Constitutional: Negative.   HENT: Negative.    Eyes: Negative.   Respiratory: Negative.    Gastrointestinal: Negative.   Genitourinary: Negative.   Musculoskeletal: Negative.   Skin: Negative.   Neurological: Negative.   Endo/Heme/Allergies: Negative.   Psychiatric/Behavioral: Negative.    All other systems reviewed and are negative.     All other systems are reviewed and negative.    PHYSICAL EXAM: VS:  BP 124/69   Pulse 74   Ht 6' 2 (1.88 m)   Wt 219 lb 9.6 oz (99.6 kg)   SpO2 96%   BMI 28.19 kg/m  , BMI Body mass index is 28.19 kg/m. Last weight:  Wt Readings from Last 3 Encounters:  05/22/24 219 lb 9.6 oz (99.6 kg)  01/24/24 218 lb (98.9 kg)  10/18/23 227 lb 6.4 oz (103.1 kg)     Physical Exam Vitals reviewed.  Constitutional:      Appearance: Normal appearance. He is normal weight.  HENT:     Head: Normocephalic.     Nose: Nose normal.     Mouth/Throat:     Mouth: Mucous membranes are moist.  Eyes:     Pupils: Pupils are equal, round, and reactive to light.  Cardiovascular:     Rate and Rhythm: Normal rate and regular rhythm.     Pulses: Normal pulses.     Heart sounds: Normal heart sounds.  Pulmonary:     Effort: Pulmonary effort is normal.  Abdominal:     General: Abdomen is flat. Bowel sounds are normal.  Musculoskeletal:        General: Normal range of motion.     Cervical back: Normal range of motion.  Skin:    General: Skin is warm.  Neurological:     General: No focal deficit present.     Mental Status: He is alert.  Psychiatric:        Mood and Affect: Mood normal.       EKG:   Recent Labs: No results found for requested labs within last 365 days.    Lipid Panel    Component Value Date/Time   CHOL 85 08/31/2017 0348   TRIG 83 08/31/2017 0348   HDL 26 (L) 08/31/2017 0348   CHOLHDL 3.3 08/31/2017 0348   VLDL 17 08/31/2017 0348    LDLCALC 42 08/31/2017 0348      Other studies Reviewed: Additional studies/ records that were reviewed today include:  Review  of the above records demonstrates:       No data to display            ASSESSMENT AND PLAN:    ICD-10-CM   1. Mixed hyperlipidemia  E78.2     2. Tachy-brady syndrome (HCC)  I49.5     3. CHF (congestive heart failure), NYHA class III, acute on chronic, diastolic (HCC)  I50.33     4. Coronary artery disease involving native coronary artery of native heart without angina pectoris  I25.10     5. Chronic atrial fibrillation (HCC)  I48.20     6. Coronary angioplasty status  Z98.61     7. Benign essential hypertension  I10     8. Localized edema  R60.0    Continue demadex  in am, can twice a day PRN       Problem List Items Addressed This Visit       Cardiovascular and Mediastinum   Chronic atrial fibrillation (HCC) (Chronic)   Tachy-brady syndrome (HCC)   Coronary artery disease involving native coronary artery of native heart without angina pectoris   CHF (congestive heart failure), NYHA class III, acute on chronic, diastolic (HCC)   Benign essential hypertension     Other   Coronary angioplasty status   Mixed hyperlipidemia - Primary   Other Visit Diagnoses       Localized edema       Continue demadex  in am, can twice a day PRN          Disposition:   Return in about 3 months (around 08/20/2024).    Total time spent: 30 minutes  Signed,  Denyse Bathe, MD  05/22/2024 1:31 PM    [facility]

## 2024-06-10 ENCOUNTER — Other Ambulatory Visit: Payer: Self-pay | Admitting: Cardiovascular Disease

## 2024-06-10 DIAGNOSIS — I10 Essential (primary) hypertension: Secondary | ICD-10-CM

## 2024-08-22 ENCOUNTER — Ambulatory Visit: Admitting: Cardiovascular Disease
